# Patient Record
Sex: Female | Born: 1950 | Race: White | Hispanic: No | Marital: Married | Smoking: Never smoker
Health system: Southern US, Community
[De-identification: ages and names within clinical notes are randomized; demographics above are authoritative.]

## PROBLEM LIST (undated history)

## (undated) DIAGNOSIS — C50919 Malignant neoplasm of unspecified site of unspecified female breast: Secondary | ICD-10-CM

## (undated) DIAGNOSIS — F32A Depression, unspecified: Secondary | ICD-10-CM

## (undated) DIAGNOSIS — B019 Varicella without complication: Secondary | ICD-10-CM

## (undated) DIAGNOSIS — K635 Polyp of colon: Secondary | ICD-10-CM

## (undated) DIAGNOSIS — G5600 Carpal tunnel syndrome, unspecified upper limb: Secondary | ICD-10-CM

## (undated) DIAGNOSIS — F329 Major depressive disorder, single episode, unspecified: Secondary | ICD-10-CM

## (undated) DIAGNOSIS — E785 Hyperlipidemia, unspecified: Secondary | ICD-10-CM

## (undated) DIAGNOSIS — I10 Essential (primary) hypertension: Secondary | ICD-10-CM

## (undated) DIAGNOSIS — Z1501 Genetic susceptibility to malignant neoplasm of breast: Secondary | ICD-10-CM

## (undated) DIAGNOSIS — N641 Fat necrosis of breast: Secondary | ICD-10-CM

## (undated) DIAGNOSIS — K2270 Barrett's esophagus without dysplasia: Secondary | ICD-10-CM

## (undated) DIAGNOSIS — K219 Gastro-esophageal reflux disease without esophagitis: Secondary | ICD-10-CM

## (undated) HISTORY — DX: Polyp of colon: K63.5

## (undated) HISTORY — DX: Essential (primary) hypertension: I10

## (undated) HISTORY — DX: Varicella without complication: B01.9

## (undated) HISTORY — PX: OTHER SURGICAL HISTORY: SHX169

## (undated) HISTORY — DX: Fat necrosis of breast: N64.1

## (undated) HISTORY — DX: Malignant neoplasm of unspecified site of unspecified female breast: C50.919

## (undated) HISTORY — PX: CARPAL TUNNEL RELEASE: SHX101

## (undated) HISTORY — DX: Barrett's esophagus without dysplasia: K22.70

## (undated) HISTORY — DX: Genetic susceptibility to malignant neoplasm of breast: Z15.01

## (undated) HISTORY — DX: Major depressive disorder, single episode, unspecified: F32.9

## (undated) HISTORY — DX: Depression, unspecified: F32.A

## (undated) HISTORY — DX: Gastro-esophageal reflux disease without esophagitis: K21.9

## (undated) HISTORY — DX: Hyperlipidemia, unspecified: E78.5

## (undated) HISTORY — DX: Carpal tunnel syndrome, unspecified upper limb: G56.00

---
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
## 1968-03-18 HISTORY — PX: CHOLECYSTECTOMY: SHX55

## 1968-03-18 HISTORY — PX: APPENDECTOMY: SHX54

## 1988-03-18 DIAGNOSIS — C50919 Malignant neoplasm of unspecified site of unspecified female breast: Secondary | ICD-10-CM

## 1988-03-18 HISTORY — DX: Malignant neoplasm of unspecified site of unspecified female breast: C50.919

## 1988-03-18 HISTORY — PX: BREAST SURGERY: SHX581

## 1988-11-16 DIAGNOSIS — G5600 Carpal tunnel syndrome, unspecified upper limb: Secondary | ICD-10-CM

## 1988-11-16 HISTORY — DX: Carpal tunnel syndrome, unspecified upper limb: G56.00

## 1995-03-19 HISTORY — PX: ABDOMINAL HYSTERECTOMY: SHX81

## 2002-01-25 ENCOUNTER — Ambulatory Visit (HOSPITAL_BASED_OUTPATIENT_CLINIC_OR_DEPARTMENT_OTHER): Admission: RE | Admit: 2002-01-25 | Discharge: 2002-01-26 | Payer: Self-pay | Admitting: Plastic Surgery

## 2002-01-25 ENCOUNTER — Encounter (INDEPENDENT_AMBULATORY_CARE_PROVIDER_SITE_OTHER): Payer: Self-pay | Admitting: Specialist

## 2002-03-18 HISTORY — PX: BREAST REDUCTION SURGERY: SHX8

## 2004-03-18 DIAGNOSIS — K635 Polyp of colon: Secondary | ICD-10-CM

## 2004-03-18 HISTORY — DX: Polyp of colon: K63.5

## 2004-06-26 ENCOUNTER — Ambulatory Visit: Payer: Self-pay | Admitting: Orthopedic Surgery

## 2004-08-02 ENCOUNTER — Ambulatory Visit: Payer: Self-pay | Admitting: General Surgery

## 2004-09-25 ENCOUNTER — Ambulatory Visit: Payer: Self-pay | Admitting: General Surgery

## 2005-03-18 DIAGNOSIS — N641 Fat necrosis of breast: Secondary | ICD-10-CM

## 2005-03-18 HISTORY — DX: Fat necrosis of breast: N64.1

## 2005-07-22 ENCOUNTER — Ambulatory Visit: Payer: Self-pay | Admitting: General Surgery

## 2005-07-24 ENCOUNTER — Ambulatory Visit: Payer: Self-pay | Admitting: General Surgery

## 2005-12-31 ENCOUNTER — Ambulatory Visit: Payer: Self-pay | Admitting: Gastroenterology

## 2006-03-18 DIAGNOSIS — K2270 Barrett's esophagus without dysplasia: Secondary | ICD-10-CM

## 2006-03-18 HISTORY — DX: Barrett's esophagus without dysplasia: K22.70

## 2006-07-25 ENCOUNTER — Ambulatory Visit: Payer: Self-pay | Admitting: General Surgery

## 2007-02-18 ENCOUNTER — Ambulatory Visit: Payer: Self-pay | Admitting: Internal Medicine

## 2007-02-23 ENCOUNTER — Ambulatory Visit: Payer: Self-pay | Admitting: Internal Medicine

## 2007-02-23 ENCOUNTER — Encounter: Payer: Self-pay | Admitting: Internal Medicine

## 2007-04-04 DIAGNOSIS — K589 Irritable bowel syndrome without diarrhea: Secondary | ICD-10-CM

## 2007-04-04 DIAGNOSIS — K219 Gastro-esophageal reflux disease without esophagitis: Secondary | ICD-10-CM

## 2007-04-04 DIAGNOSIS — E785 Hyperlipidemia, unspecified: Secondary | ICD-10-CM | POA: Insufficient documentation

## 2007-04-04 DIAGNOSIS — K2270 Barrett's esophagus without dysplasia: Secondary | ICD-10-CM

## 2007-04-04 DIAGNOSIS — Z853 Personal history of malignant neoplasm of breast: Secondary | ICD-10-CM | POA: Insufficient documentation

## 2007-04-04 DIAGNOSIS — F411 Generalized anxiety disorder: Secondary | ICD-10-CM | POA: Insufficient documentation

## 2007-05-05 ENCOUNTER — Ambulatory Visit: Payer: Self-pay | Admitting: Unknown Physician Specialty

## 2007-05-05 ENCOUNTER — Encounter: Payer: Self-pay | Admitting: Internal Medicine

## 2007-05-20 ENCOUNTER — Ambulatory Visit: Payer: Self-pay | Admitting: Internal Medicine

## 2007-05-20 LAB — CONVERTED CEMR LAB
ALT: 20 units/L (ref 0–35)
Amylase: 48 units/L (ref 27–131)
Bilirubin, Direct: 0.1 mg/dL (ref 0.0–0.3)
Lipase: 16 units/L (ref 11.0–59.0)
Total Bilirubin: 0.8 mg/dL (ref 0.3–1.2)
Total Protein: 6.9 g/dL (ref 6.0–8.3)

## 2007-06-18 ENCOUNTER — Encounter: Payer: Self-pay | Admitting: Internal Medicine

## 2007-07-28 ENCOUNTER — Encounter: Payer: Self-pay | Admitting: Internal Medicine

## 2007-08-12 ENCOUNTER — Ambulatory Visit: Payer: Self-pay | Admitting: General Surgery

## 2007-09-15 DIAGNOSIS — D649 Anemia, unspecified: Secondary | ICD-10-CM | POA: Insufficient documentation

## 2007-09-15 DIAGNOSIS — D72819 Decreased white blood cell count, unspecified: Secondary | ICD-10-CM | POA: Insufficient documentation

## 2007-09-15 DIAGNOSIS — R945 Abnormal results of liver function studies: Secondary | ICD-10-CM

## 2007-09-15 DIAGNOSIS — K449 Diaphragmatic hernia without obstruction or gangrene: Secondary | ICD-10-CM | POA: Insufficient documentation

## 2007-09-15 DIAGNOSIS — R112 Nausea with vomiting, unspecified: Secondary | ICD-10-CM

## 2007-09-15 DIAGNOSIS — M549 Dorsalgia, unspecified: Secondary | ICD-10-CM | POA: Insufficient documentation

## 2007-09-24 ENCOUNTER — Ambulatory Visit: Payer: Self-pay | Admitting: General Surgery

## 2008-07-20 ENCOUNTER — Encounter: Payer: Self-pay | Admitting: Internal Medicine

## 2008-07-27 ENCOUNTER — Encounter: Payer: Self-pay | Admitting: Internal Medicine

## 2008-08-16 ENCOUNTER — Ambulatory Visit: Payer: Self-pay | Admitting: General Surgery

## 2008-10-26 ENCOUNTER — Encounter: Payer: Self-pay | Admitting: Internal Medicine

## 2009-01-11 ENCOUNTER — Encounter (INDEPENDENT_AMBULATORY_CARE_PROVIDER_SITE_OTHER): Payer: Self-pay | Admitting: *Deleted

## 2009-02-06 ENCOUNTER — Encounter (INDEPENDENT_AMBULATORY_CARE_PROVIDER_SITE_OTHER): Payer: Self-pay | Admitting: *Deleted

## 2009-02-07 ENCOUNTER — Ambulatory Visit: Payer: Self-pay | Admitting: Internal Medicine

## 2009-02-16 ENCOUNTER — Ambulatory Visit: Payer: Self-pay | Admitting: Internal Medicine

## 2009-02-21 ENCOUNTER — Encounter: Payer: Self-pay | Admitting: Internal Medicine

## 2009-08-22 ENCOUNTER — Ambulatory Visit: Payer: Self-pay | Admitting: Unknown Physician Specialty

## 2009-11-01 ENCOUNTER — Ambulatory Visit: Payer: Self-pay | Admitting: Unknown Physician Specialty

## 2009-11-02 LAB — PATHOLOGY REPORT

## 2010-07-31 NOTE — Assessment & Plan Note (Signed)
[facility]                         GASTROENTEROLOGY OFFICE NOTE   Erin Roberts, Erin Roberts                     MRN:          [id number]  DATE:05/20/2007                            DOB:          08/14/1950    Erin Roberts is a 60 year old, white female, who we have recently  diagnosed and confirmed the diagnosis of Barrett's esophagus, which was  found on previous upper endoscopy in [place].  Repeat biopsies  confirmed the presence of intestinal metaplasia and glandular atypia.  She has been on Prilosec 20 mg a day.  About every four to six weeks,  she develops nausea with subsequent vomiting, which triggers the whole  sequence of sickness, which lasts about two days.  During this time,  patient is unable to eat and she is usually incapacitated.  This has  been occurring intermittently for about a year.  She gives a history of  breast cancer status post chemotherapy in the 1990s.  She also had a  colonoscopy by Dr. Electa Sniff in the past, and she had an open  cholecystectomy for cholelithiasis in 1970.  This was an uncomplicated  procedure, which was elective.  She has never had pancreatitis.  Most  recently on CT scan of the abdomen ordered by Dr. Lin Givens, she was  found to have a generous common bile duct measuring 9 mm with mild  prominence of intrahepatic radicals, but no definite stone.  Her  pancreas appeared normal.  The biliary ampulla was not well visualized.  The patient's overall condition is satisfactory in that she has not lost  any weight, she has a good appetite except for the episode of vomiting.   MEDICATIONS:  1. Simvastatin 20 mg p.o. daily.  2. Zolpidem Tartrate 10 mg p.o. p.r.n.  3. Effexor-XR 75 mg p.o. daily.  4. Prilosec 20 mg p.o. daily.   PHYSICAL EXAMINATION:  VITAL SIGNS:  Blood pressure 130/68, pulse 88,  weight 147 pounds.  GENERAL:  She was alert, oriented, in no distress.  HEENT:  Fundus normal, sclerae are not  icteric.  NECK:  Supple, no adenopathy.  LUNGS:  Clear to auscultation.  COR:  Normal S1 and normal S2.  ABDOMEN:  Soft and nontender with normoactive bowel sounds, and a well-  healed right upper quadrant cholecystectomy scar.  The whole abdomen was  normal.  EXTREMITIES:  No edema.   IMPRESSION:  109. A 60 year old, white female with Barrett's esophagus.  2. Periodic nausea and vomiting.  May be related to breakthrough      gastroesophageal reflux disease.  Recent endoscopy did not show any      anatomic abnormalities of her upper gastrointestinal tract.  3. Abnormal computerized tomography scan of the abdomen showing mild      prominence of the intrahepatic biliary radicals as well as common      bile duct.  The size of the common bile duct is normal for a      postcholecystectomy state and may not represent any obstruction.      Patient's symptoms of periodic vomiting may or may not be related      to  the abnormality on the computerized tomography scan.   PLAN:  1. Increase Prilosec to 20 mg p.o. b.i.d.  This will hopefully control      the breakthrough reflux.  2. We will try to obtain a CT scan from 1990s to see where there was      any evidence of biliary dilatation at that time, which would point      to a chronic state rather than a progressive process.  3. Liver function tests, amylase, lipase, and CA19-9 today.  4. Depending on the results of the lab tests and the comparison with      the prior CT scan, I would consider an ERCP.  At this point,      however, I do not have enough evidence that the abnormality is      pathological and that her symptoms are in any way related to this      abnormality.  5. Phenergan 25 mg, take p.r.n. nausea at the onset of the attack.     Hedwig Morton. Juanda Chance, MD  Electronically Signed    DMB/MedQ  DD: 05/20/2007  DT: 05/20/2007  Job #: [id number]   cc:   Elnita Maxwell L. Lin Givens, M.D.  Adela Glimpse

## 2010-07-31 NOTE — Assessment & Plan Note (Signed)
[facility]                         GASTROENTEROLOGY OFFICE NOTE   Erin Roberts, Erin Roberts                     MRN:          [id number]  DATE:02/18/2007                            DOB:          04/19/1950    Erin Roberts is a very nice 60 year old white female who comes for second  opinion of Barrett's esophagus.  She was found to have a Barrett's  esophagus on upper endoscopy 1 year ago after having 6 month history of  gastroesophageal reflux disease and dyspepsia.  She denies ever having  reflux prior to that, even during her pregnancies.  She denied  dysphagia.  On biopsies by Dr. Marva Panda, the path report esophageus was  acute and chronic inflammation consistent with Barrett's esophagus.  I  could not find any description as to whether there were any goblet cells  or metaplasia.  The patient was put on Prilosec 20 mg a day, which she  has taken religiously on a daily basis with complete resolution of all  of her GI symptoms.  She has discontinued the Prilosec for 2 to 3 days  at a time only to find out that she has recurrent reflux.  She really  has not followed antireflux measures.  As far as her irritable bowel  syndrome is concerned, she has been doing better on high fiber diet.  She has a history of colon polyps in the past.  Her last colonoscopy 3  to 4 years ago.  She is for recall next year.   MEDICATIONS:  1. Simvastatin 20 mg p.o. daily.  2. Zolpidem 10 mg p.o. daily.  3. Effexor XR 75 mg p.o. daily since 2003.  4. Prilosec 20 mg p.o. daily.   PAST HISTORY:  Significant for carpal tunnel syndrome surgery,  appendectomy, breast biopsies, hysterectomy, open cholecystectomy.  She  had cancer of the breast and hyperlipidemia.   FAMILY HISTORY:  Negative for colon cancer.  Positive for diabetes in  father and sister.   SOCIAL HISTORY:  Married with 1 child.  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  She does not smoke but drinks alcohol  socially.   REVIEW OF SYSTEMS:  Positive for history of symptoms as in the history  of present illness.   PHYSICAL EXAM:  Blood pressure 140/70, pulse 88, weight 146 pounds.  She was alert and oriented, in no acute distress.  Sclerae not icteric.  NECK:  Supple.  No adenopathy.  LUNGS:  Clear to auscultation.  Her voice was normal.  No cough.  COR:  Normal S1, normal S2.  ABDOMEN:  Soft and nontender with somewhat protuberant, but without any  palpable mass or fullness.  RECTAL:  Normal rectal tone.  Stool was Hemoccult negative.   IMPRESSION:  91. A 60 year old white female with diagnosis of Barrett's esophagus      base on upper endoscopy and biopsies of the gastroesophageal      junction in October 2007.  The patient is questioning the      diagnosis.  2. Gastroesophageal reflux disease, currently under good control on      Prilosec 20  mg a day.  3. History of irritable bowel syndrome.  Currently under good control.  4. History of colon polyps.  As per the patient's report, she is      apparently due for colonoscopy next year.   PLAN:  1. I will have her esophageal biopsies reviewed by pathology at [facility].  2. Repeat upper endoscopy with appropriate biopsies to assess      Barrett's esophagus.  3. Continue Prilosec 20 mg daily for now.  4. The patient was instructed on antireflux measures.     Erin Roberts. Juanda Chance, MD  Electronically Signed    DMB/MedQ  DD: 02/18/2007  DT: 02/18/2007  Job #: [id number]   cc:   Dr. Francia Greaves

## 2010-08-03 NOTE — Letter (Signed)
June 03, 2007    Francia Greaves, M.D.  [address]  [facility]  [place], [place]  [address]   RE:  ALVARETTA, EISENBERGER  MRN:  [id number]  /  DOB:  1950/03/25   Dear Dr. Lin Givens:   I would like to give you a followup on Ms. Funnell who was recently  diagnosed with Barrett's esophagus. Her upper endoscopy in December 2008  showed fragments of intestinal metaplasia with some glandular atypia.  There was no dysplasia. She is scheduled for next upper endoscopy in two  years, that is in December 2010. Her CT scan of the abdomen done in your  office showed some abnormality pertaining to mild  ampullary fullness,  but her liver function tests have been normal according to her, and her  common bile duct looked normal. Her pancreas also looked normal. I  obtained old CT scans for review .The one from 2001 showed normal  pancreas, spleen, and small cystic lesions in the liver which were  rather stable, there were no comments pertaining to her biliary tract.   I have spoken to Ms. Dekay today, and she is feeling fine on  omeprazole 10 mg a day. The episodes of nausea and queasiness have  subsided. Since I do not have any evidence of clear gastrointestinal  pathology here pertaining to her pancreas or bile duct, I am going to  hold off on the ERCP. I talked to Ms. Traywick about it, and she agrees  that she does not want anything done unless she develops specific  symptoms.   I have encouraged her to have a liver function test done in your office  in three months, and as long as they are normal, she does not need to  see me. I will have to do a repeat endoscopy on her in two years anyway,  but if before that her gastrointestinal symptoms recur I would consider  doing a diagnostic ERCP or possibly repeating the CT scan or even doing  an MRCP to have a  better view of the common bile duct and intrahepatic ducts as well. We  will make the decision at the time of her next office  visit. Thank you  for allowing me to share in her care.   Best regards,    Sincerely,      Hedwig Morton. Juanda Chance, MD  Electronically Signed    DMB/MedQ  DD: 06/03/2007  DT: 06/03/2007  Job #: [id number]

## 2010-08-03 NOTE — Op Note (Signed)
NAMEANIAYA, BACHA                        ACCOUNT NO.:  [id number]   MEDICAL RECORD NO.:  [id number]                   PATIENT TYPE:  AMB   LOCATION:  DSC                                  FACILITY:  [facility]   PHYSICIAN:  Brantley Persons, M.D.             DATE OF BIRTH:  1950-08-28   DATE OF PROCEDURE:  01/25/2002  DATE OF DISCHARGE:                                 OPERATIVE REPORT   PREOPERATIVE DIAGNOSES:  1. History of right lumpectomy with radiation therapy for breast cancer.  2. Bilateral macromastia.   POSTOPERATIVE DIAGNOSES:  1. History of right lumpectomy with radiation therapy for breast cancer.  2. Bilateral macromastia.   OPERATION/PROCEDURE:  Bilateral reduction mammoplasty.   SURGEON:  Brantley Persons, M.D.   ASSISTANT:  Bing Neighbors. Clearance Coots, M.D.   ESTIMATED BLOOD LOSS:  300 cc.   FLUIDS REPLACED:  2400 cc crystalloid.   URINE OUTPUT:  200 cc.   ANESTHESIA:  General endotracheal anesthesia.   ANESTHESIOLOGIST:  Burna Forts, M.D.   COMPLICATIONS:  None.   INDICATIONS:  The patient is a 60 year old Caucasian female who has  undergone a right lumpectomy with radiation therapy for treatment of right  breast cancer.  In that process her right breast is now smaller compared to  the left.  Additionally she also has clinical signs of macromastia that is  asymptomatic.  She would like to have more symmetric breasts as well as  smaller breasts.  Due to this we can perform bilateral reduction  mammoplasties to even out the size and shape of her breasts.  The right  breast radiation changes will need to be excised and then the breast shaped  accordingly.  The patient requests that we proceed with the surgery at this  time.   JUSTIFICATION FOR OVERNIGHT STAY:  Progressive pain control, along with  ambulation and monitoring of nipples and breast flaps.   DESCRIPTION OF PROCEDURE:  The patient was marked in the preoperative  holding area in the pattern  of Wise for the future reduction mammoplasties.  She was then taken back to the OR and placed on the table in the supine  position.  After adequate general endotracheal anesthesia was obtained, the  base of the breasts were injected with 1% lidocaine with epinephrine.  A  minimal amount was placed in the right breast compared to the left due to  the radiation changes.  The chest was then prepped with Betadine and alcohol  and draped in the sterile fashion.   First, the right breast reduction was performed.  The nipple was marked with  a 45 mm nipple marker.  This was then incised and skin de-epithelialized  around it down to the mammary crease in inferior pedicle pattern.  Next, the  medial, superior and lateral skin flaps were elevated down to the chest  wall.  The excess fat and glandular tissue were excised from the inferior  pedicle.  In the course of the de-epithelialization, there was a lot of  scarring and fibrosis that was apparent in the skin affected by the  radiation therapy.  I also left a broad base to the inferior pedicle to  maintain good blood supply to the nipple.  As much of the skin affected by  the radiation changes as well as the breast tissue was excised as could be  done to still allow a reasonable size to the patient's breast.  The nipple  was then examined and found to be pink and viable.  The wound was irrigated  with saline irrigation.  Meticulous hemostasis was obtained with the Bovie  electrocautery.  A #10 JP flat, fully fluted drain was then placed into the  wound.  The skin flaps were brought together at the inverted T junction with  a 2-0 Prolene suture.  The incisions were stapled for temporary closure.   Attention was then turned to the left breast.  In a similar fashion the  nipple was marked with a 45 mm nipple marker.  This was incised.  The skin  was then de-epithelialized down to the inframammary crease  in an inferior  pedicle pattern.  Next, the  medial, superior and lateral skin flaps were  elevated down to the chest wall.  The excess fat and glandular tissue were  removed from inferior pedicle.  The nipple was then examined and found to be  pink and viable.  The wound was irrigated saline irrigation.  Meticulous  hemostasis obtained with the Bovie electrocautery.  The inferior pedicle was  centralized using 3-0 Prolene suture.  The #10 JP flat, fully fluted drain  was then placed into the wound.  The skin flaps were brought together and  the inverted T junction with a 2-0 Prolene suture.  The incision was then  stapled for temporary closure.   The breasts were then compared and found to have good shape and symmetry.  All the staples were then removed and the incisions closed using 3-0  Monocryl in a dermal layer followed by a 4-0 Monocryl running intracuticular  stitch on the skin.  The patient was then placed in the upright position.  The future location of the nipple-areolar complexes was marked using the 38  mm nipple marker.  She was then placed back into the recumbent position.   First, the future nipple-areolar complex on the right breast mound was  incised.  This tissue was excised full-thickness.  The nipple was then  examined and found to be pink and viable, brought out into this aperture and  sewn in place using 4-0 Monocryl interrupted dermal sutures followed by 5-0  Monocryl intracuticular stitch on the skin.  Most of the fibrotic skin had  been removed so most of this skin was soft.  In a likewise fashion, the  future nipple-areolar complex on the left breast mound was incised .  The  tissue was excised full-thickness.  The nipple was then examined and found  to be pink and viable and brought out into the aperture and sewn in place  using 4-0 Monocryl interrupted dermal sutures followed by 5-0 Monocryl  intracuticular stitch on the skin.  The JP drains were sewn in place using 3- 0 nylon suture.  The incisions were  then dressed with Benzoin and Steri-  Strips, the nipples additionally bacitracin ointment and Adaptic.  4 x 4s  were then placed over the incisions and the patient was placed in a light  postoperative support bra.  There were no complications.  The patient  tolerated the procedure well.   The patient was then extubated and taken to the recovery room in stable  condition.  The final needle and sponge counts were reported to be correct  at the end of the case.  The patient will remain overnight in the [facility] for  aggressive pain control along with ambulation, monitoring of the nipples and  breast flaps.  Discharge is planned for the morning.   AMOUNTS OF BREAST TISSUE REMOVED:  Right breast:  324 g.  Left breast:  614  g.                                               Brantley Persons, M.D.    MC/MEDQ  D:  01/25/2002  T:  01/26/2002  Job:  [id number]   cc:   Leonette Most A. Clearance Coots, M.D.  [address]  [place]  [place] [address]  Fax: [phone]

## 2010-09-27 ENCOUNTER — Ambulatory Visit: Payer: Self-pay | Admitting: Unknown Physician Specialty

## 2011-03-19 DIAGNOSIS — I10 Essential (primary) hypertension: Secondary | ICD-10-CM

## 2011-03-19 HISTORY — DX: Essential (primary) hypertension: I10

## 2011-04-12 ENCOUNTER — Encounter: Payer: Self-pay | Admitting: Internal Medicine

## 2011-10-01 ENCOUNTER — Ambulatory Visit: Payer: Self-pay | Admitting: Internal Medicine

## 2011-10-02 ENCOUNTER — Ambulatory Visit: Payer: Self-pay | Admitting: Internal Medicine

## 2011-10-28 ENCOUNTER — Ambulatory Visit: Payer: Self-pay | Admitting: Internal Medicine

## 2011-11-14 ENCOUNTER — Ambulatory Visit: Payer: Self-pay | Admitting: General Surgery

## 2011-12-05 ENCOUNTER — Encounter: Payer: Self-pay | Admitting: Internal Medicine

## 2011-12-12 ENCOUNTER — Telehealth: Payer: Self-pay | Admitting: Internal Medicine

## 2011-12-12 NOTE — Telephone Encounter (Signed)
Transferred GI Care elsewhere

## 2012-03-18 HISTORY — PX: MASTECTOMY: SHX3

## 2012-03-30 ENCOUNTER — Ambulatory Visit: Payer: Self-pay | Admitting: Internal Medicine

## 2012-04-18 HISTORY — PX: UPPER GI ENDOSCOPY: SHX6162

## 2012-05-05 ENCOUNTER — Ambulatory Visit: Payer: Self-pay | Admitting: General Surgery

## 2012-05-16 HISTORY — PX: COLONOSCOPY: SHX174

## 2012-06-09 ENCOUNTER — Ambulatory Visit: Payer: Self-pay | Admitting: General Surgery

## 2012-06-09 DIAGNOSIS — D128 Benign neoplasm of rectum: Secondary | ICD-10-CM

## 2012-06-09 DIAGNOSIS — D129 Benign neoplasm of anus and anal canal: Secondary | ICD-10-CM

## 2012-06-10 ENCOUNTER — Encounter: Payer: Self-pay | Admitting: General Surgery

## 2012-06-15 ENCOUNTER — Telehealth: Payer: Self-pay | Admitting: General Surgery

## 2012-06-15 NOTE — Telephone Encounter (Signed)
The patient underwent a colonoscopy on 06/10/2012 to followup previously resected colonic polyps. 4 polyps were then applied in the rectum, the largest at 15 cm. The dominant polyp at 15 cm was a tubular adenoma without evidence of atypia or dysplasia. The remaining polyps were hyperplastic.  The patient has been encouraged to have a follow up exam in 5 years.

## 2012-06-16 ENCOUNTER — Encounter: Payer: Self-pay | Admitting: General Surgery

## 2012-11-03 ENCOUNTER — Ambulatory Visit: Payer: Self-pay | Admitting: General Surgery

## 2012-11-04 ENCOUNTER — Encounter: Payer: Self-pay | Admitting: General Surgery

## 2012-11-11 ENCOUNTER — Ambulatory Visit (INDEPENDENT_AMBULATORY_CARE_PROVIDER_SITE_OTHER): Payer: 59 | Admitting: General Surgery

## 2012-11-11 ENCOUNTER — Encounter: Payer: Self-pay | Admitting: General Surgery

## 2012-11-11 VITALS — BP 132/70 | HR 74 | Resp 12 | Ht 62.0 in | Wt 155.0 lb

## 2012-11-11 DIAGNOSIS — R920 Mammographic microcalcification found on diagnostic imaging of breast: Secondary | ICD-10-CM

## 2012-11-11 NOTE — Patient Instructions (Addendum)
Patient to be scheduled for right breast stereotactic biopsy.   Breast Biopsy, Stereotactic A stereotactic breast biopsy takes a tissue sample from the breast with a special instrument. This is done when:  The problem (lump, abnormality, mass) can be seen on X-ray, but not felt on physical exam.  Suspicious, small calcium deposits (calcifications) are seen in the breast.  There is a change in shape or appearance of the breast, thickening, or asymmetry on mammogram (breast X-ray).  You have nipple changes (unusual or bloody discharge, crusting, retraction, dimpling).  Your caregiver is making a surgical diagnosis. The biopsy may be done on a special table, with your face down and your breasts placed through openings in the table. Computerized imaging (special form of X-rays) is used. The images are not obtained using regular X-ray film. So, exposure to radiation is reduced. Images are seen through several different angles. The surgeon removes small pieces of the suspicious tissue through a hollow needle. The tissue will be sent to the lab for analysis. The surgeon can look at the pictures right away, rather than wait for an X-ray to be developed. Your caregiver can mark the lesions (abnormal tissue formations) electronically. Then the computer can tell exactly where the problem is, or if it has moved. BENEFITS OF THE PROCEDURE  This is a good way to see if tiny lumps, abnormal looking tissue, or calcium deposits that you cannot feel are cancerous or require further treatment or follow-up.  Needle biopsy is a simple procedure. It may be performed in an outpatient imaging center. This means you have the procedure and go home the same day, without checking into a hospital.  It is less painful than open surgery. The results are as accurate as when a tissue sample is removed surgically.  The procedure is faster, less expensive, less invasive, does not distort the breast, and leaves little or no  scar.  Breast defects, which can make future mammograms hard to read and interpret, do not remain.  Recovery time is brief. Patients can soon resume their normal activities.  Using VAD (vacuum assisted device) may make it possible to remove entire lesions.  A breast biopsy can indicate if you need surgery, other treatment, or combined treatment. LET YOUR CAREGIVER KNOW ABOUT:  Allergies.  Medications taken, including herbs, eye drops, over-the-counter medications, and creams.  Use of steroids (by mouth or creams).  Previous problems with anesthetics or numbing medication.  If you are taking blood thinner medications or aspirin.  Possibility of pregnancy, if this applies.  History of blood clots (thrombophlebitis).  History of bleeding or blood problems.  Previous surgery.  Other health problems. RISKS AND COMPLICATIONS  Infection (germ growing in the wound). This can often be treated with antibiotics.  Bleeding, following surgery. Your surgeon takes every precaution to keep this from happening.  There is some concern that if a cancerous mass is present, cancer cells might be spread by the needle. Whether this actually happens is not known. It does not appear to be a significant risk.  X-ray guided breast biopsy is not infallible (not always correct). The problem may be missed or the extent of the problem may be underestimated. This would mean the biopsy did not manage to remove a piece of the diseased tissue or enough of the diseased tissue.  Lesions present, with calcium deposits scattered throughout the breast, are difficult to target by stereotactic method. Those lesions near the chest wall also are hard to learn about by this method.  If the mammogram shows only a vague change in tissue density, but no definite mass or nodule, the X-ray guided method may not be successful. Occasionally, even after a successful biopsy, the tissue diagnosis remains uncertain. A surgical  biopsy will be needed, if abnormal or precancerous cells are found on core biopsy.  Altering or deforming of the breast.  Unable to find, or missing the lesion.  Rarely, the needle may go through the chest wall into the lung area. TWO BIOPSY INSTRUMENTS MAY BE USED IN THE PROCEDURE The conventional biopsy device (core needle biopsy device) consists of an inner needle with a trough extending from it at one end, and an overlying sheath. It is attached to a spring-loaded mechanism that propels it forward. The trough fills with tissue. The outer sheath instantly moves forward to cut the tissue and keep it in the trough. Each sample is obtained in a fraction of a second. It is necessary to withdraw the needle after each sample is taken to collect the tissue.  A newer type of instrument, the VAD (vacuum assisted device), uses vacuum pressure to pull breast tissue into a needle and remove it. The needle does not need to be withdrawn after each sampling. Another advantage is that biopsies are obtained in an orderly manner, by rotating the device. This helps make sure that the entire area of interest will be sampled. When using the automated core biopsy needle, sampling is more random.  FOR COMFORT DURING THE TEST  Relax as much as possible.  Try to follow instructions, to speed up the test.  Let your caregiver know if you are uncomfortable, anxious, or in pain. PROCEDURE  You are awake during the procedure, and you go home the same day (outpatient). A specially trained radiologist will do this procedure. First, the skin is cleansed. Then, it is injected with a local anesthetic. A small nick is made in the skin, and the tip of the biopsy needle is put into the calculated site of the lesion. A special mammography machine uses ionizing radiation to help guide the radiologist's instrument to the site of the abnormal growth. At this point, stereo images are again obtained, to confirm that the needle tip is at  the problem area. Usually 5 to 10 samples are collected when doing a core biopsy. At least 12 are collected when using the vacuum assisted device (VAD). Then, a final set of images is obtained. If they show that the lesion has been mostly or completely removed, a small clip is left at the biopsy site. This is so that it can be easily located, in case the lesion turns out to be cancer. Afterward, the skin opening is stitched (sutured) or taped closed, and covered with a dressing. Your caregiver may apply a pressure dressing and an ice pack, to prevent bleeding and swelling in the breast.  X-ray guided breast biopsy can take 30 minutes to 1 hour, or more. The X-rays usually have no side effects, and no radiation remains in your body. There is usually little or no pain. Usually no scar is left from the tiny skin incision. Many women find that the major discomfort of the procedure is from lying on their stomach, or staying in 1 position for the length of the procedure. This discomfort may be reduced by carefully placed cushions. You should wear a good support bra to the procedure. You will be asked to remove jewelry, dentures, eye glasses, metal objects, or clothing that might interfere with the  X-ray images. You may want to have someone with you, to take you home after the procedure. AFTER THE PROCEDURE   After surgery, if you are doing well and have no problems, you will be allowed to go home.  You may resume your regular diet, or as directed by your caregiver. HOME CARE INSTRUCTIONS   Follow your caregiver's recommendations for medications, care of the biopsy site, follow-up appointments, and further treatment.  Only take over-the-counter or prescription medicines for pain, discomfort, or fever as directed by your caregiver.  An ice pack applied to the affected area may help with discomfort and keep the swelling down.  Change dressings as directed.  Wear a good support bra for as long as your  caregiver recommends.  Avoid strenuous activity for at least 24 hours, or as advised by your caregiver. Finding out the results of your test Not all test results are available during your visit. If your test results are not back during the visit, make an appointment with your caregiver to find out the results. Do not assume everything is normal if you have not heard from your caregiver or the medical facility. It is important for you to follow up on all of your test results.  SEEK MEDICAL CARE IF:   You develop a rash.  You have problems with your medicines.  You become lightheaded or dizzy. SEEK IMMEDIATE MEDICAL CARE IF:   There is increased bleeding (more than a small spot) from the biopsy site.  You notice redness, swelling, or increasing pain in the wound.  Pus is coming from the wound.  You have a fever.  You notice a bad smell coming from the wound or dressing.  You develop shortness of breath.  You develop chest pain.  You pass out. Document Released: 12/01/2002 Document Revised: 05/27/2011 Document Reviewed: 01/06/2009 [facility] Patient Information 2014 [place], [place].  Patient has been scheduled for a right breast stereotactic biopsy at [facility] for 11-30-12 at 2 pm. She will check-in at the [facility] at 1:30 pm. This patient is aware of date, time, and instructions. Patient verbalizes understanding.

## 2012-11-11 NOTE — Progress Notes (Signed)
Patient ID: Erin Roberts, female   DOB: 1950-08-09, 62 y.o.   MRN: [id number]  Chief Complaint  Patient presents with  . Follow-up    mammogram    HPI Erin Roberts is a 62 y.o. female who presents for a breast evaluation. The most recent mammogram was done on 10/29/12.Patient does perform regular self breast checks and gets regular mammograms done.  The patient denies any new problems with the breasts at this time.   HPI  Past Medical History  Diagnosis Date  . Breast cancer 1990  . Carpal tunnel syndrome 1990's  . Hypertension 2013  . Malignant neoplasm of breast (female), unspecified site     Right  . Tendonitis 1990's  . Fat necrosis of breast 2007    Left  . Barrett's esophagus 2008  . Colon polyps 2006    Past Surgical History  Procedure Laterality Date  . Colonoscopy  March 2014    4 rectal polyps, one tubular adenoma. Followup exam due in 2019.  . Breast reduction surgery  2004  . Abdominal hysterectomy  1997  . Cholecystectomy  1970  . Breast lumpectomy Right 1990  . Tonsillectomy  1967  . Appendectomy  1970  . Carpal tunnel release  1998, 2004    Family History  Problem Relation Age of Onset  . Colon cancer Father 23  . Colon cancer Mother 21  . Colon cancer Sister 4    Social History History  Substance Use Topics  . Smoking status: Never Smoker   . Smokeless tobacco: Never Used  . Alcohol Use: Yes    Allergies  Allergen Reactions  . Codeine     REACTION: nausea/vomiting    Current Outpatient Prescriptions  Medication Sig Dispense Refill  . omeprazole (PRILOSEC) 20 MG capsule Take 1 capsule by mouth daily.      . simvastatin (ZOCOR) 40 MG tablet Take 1 tablet by mouth daily.      Marland Kitchen triamterene-hydrochlorothiazide (MAXZIDE-25) 37.5-25 MG per tablet Take 1 tablet by mouth daily.      Marland Kitchen venlafaxine (EFFEXOR) 75 MG tablet Take 1 tablet by mouth daily.      Marland Kitchen zolpidem (AMBIEN) 10 MG tablet Take 1 tablet by mouth daily.       No current  facility-administered medications for this visit.    Review of Systems Review of Systems  Constitutional: Negative.   Respiratory: Negative.   Cardiovascular: Negative.     Blood pressure 132/70, pulse 74, resp. rate 12, height 5\' 2"  (1.575 m), weight 155 lb (70.308 kg).  Physical Exam Physical Exam  Constitutional: She is oriented to person, place, and time. She appears well-developed and well-nourished.  Neck: No thyromegaly present.  Cardiovascular: Normal rate, regular rhythm and normal heart sounds.   No murmur heard. Pulmonary/Chest: Effort normal and breath sounds normal. Right breast exhibits no inverted nipple, no mass, no nipple discharge, no skin change and no tenderness. Left breast exhibits no inverted nipple, no mass, no nipple discharge, no skin change and no tenderness.  Well healed incisions on the left breast from a reduction mammoplasty.   Well healed incision on the right breast from her original wide excision and axillary dissection.     Lymphadenopathy:    She has no cervical adenopathy.    She has no axillary adenopathy.  Neurological: She is alert and oriented to person, place, and time.  Skin: Skin is warm and dry.    Data Reviewed Bilateral mammograms dated November 03, 2012 were reviewed. 8  area of microcalcifications adjacent to a previous biopsy clip are felt to be more heterogeneous by the radiologist. The left breast is unremarkable. BI-RAD-4.  Upper endoscopy completed December 31, 2005 showed evidence of Barrett's esophagus with acute and chronic inflammation. No dysplasia was identified.  Assessment    Minimal interval change of the right breast.     Plan    Arrangements were made for a stereotactic biopsy to remove the microcalcifications of concern.  The patient is familiar with the procedure.     Patient has been scheduled for a right breast stereotactic biopsy at [facility] for 11-30-12 at 2 pm. She will check-in at the [facility] at 1:30 pm. This patient is aware of date, time, and instructions. Patient verbalizes understanding.  The patient will be contacted to determine the time of her last upper endoscopy.  If 2007 was last exam, repeat examination is indicated due to the risk of malignancy.  Earline Mayotte 11/11/2012, 9:57 PM

## 2012-11-30 ENCOUNTER — Ambulatory Visit: Payer: Self-pay | Admitting: General Surgery

## 2012-11-30 DIAGNOSIS — R920 Mammographic microcalcification found on diagnostic imaging of breast: Secondary | ICD-10-CM

## 2012-12-01 ENCOUNTER — Telehealth: Payer: Self-pay | Admitting: General Surgery

## 2012-12-01 NOTE — Telephone Encounter (Signed)
Notified phone report from pathology showed area of breast cancer.  Arrangements to meet tomorrow afternoon to review options for care.

## 2012-12-02 ENCOUNTER — Encounter: Payer: Self-pay | Admitting: General Surgery

## 2012-12-02 ENCOUNTER — Ambulatory Visit (INDEPENDENT_AMBULATORY_CARE_PROVIDER_SITE_OTHER): Payer: 59 | Admitting: General Surgery

## 2012-12-02 VITALS — BP 140/74 | HR 88 | Resp 16 | Wt 157.0 lb

## 2012-12-02 DIAGNOSIS — Z853 Personal history of malignant neoplasm of breast: Secondary | ICD-10-CM

## 2012-12-02 DIAGNOSIS — C50911 Malignant neoplasm of unspecified site of right female breast: Secondary | ICD-10-CM

## 2012-12-02 DIAGNOSIS — C50919 Malignant neoplasm of unspecified site of unspecified female breast: Secondary | ICD-10-CM

## 2012-12-02 NOTE — Progress Notes (Signed)
Patient ID: Erin Roberts, female   DOB: 17-Oct-1950, 62 y.o.   MRN: [id number]  Chief Complaint  Patient presents with  . Follow-up    discussion    HPI Erin Roberts is a 62 y.o. female return today accompanied by her husband to discuss her recently completed right breast biopsy. She had been notified by phone of the biopsy results. HPI  Past Medical History  Diagnosis Date  . Breast cancer 1990  . Carpal tunnel syndrome 1990's  . Hypertension 2013  . Malignant neoplasm of breast (female), unspecified site     Right  . Tendonitis 1990's  . Fat necrosis of breast 2007    Left  . Barrett's esophagus 2008  . Colon polyps 2006    Past Surgical History  Procedure Laterality Date  . Colonoscopy  March 2014    4 rectal polyps, one tubular adenoma. Followup exam due in 2019.  . Breast reduction surgery  2004  . Abdominal hysterectomy  1997  . Cholecystectomy  1970  . Breast lumpectomy Right 1990  . Tonsillectomy  1967  . Appendectomy  1970  . Carpal tunnel release  1998, 2004    Family History  Problem Relation Age of Onset  . Colon cancer Father 9  . Colon cancer Mother 54  . Colon cancer Sister 74    Social History History  Substance Use Topics  . Smoking status: Never Smoker   . Smokeless tobacco: Never Used  . Alcohol Use: Yes    Allergies  Allergen Reactions  . Codeine     REACTION: nausea/vomiting    Current Outpatient Prescriptions  Medication Sig Dispense Refill  . omeprazole (PRILOSEC) 20 MG capsule Take 1 capsule by mouth daily.      . simvastatin (ZOCOR) 40 MG tablet Take 1 tablet by mouth daily.      Marland Kitchen triamterene-hydrochlorothiazide (MAXZIDE-25) 37.5-25 MG per tablet Take 1 tablet by mouth daily.      Marland Kitchen venlafaxine (EFFEXOR) 75 MG tablet Take 1 tablet by mouth daily.      Marland Kitchen zolpidem (AMBIEN) 10 MG tablet Take 1 tablet by mouth daily.       No current facility-administered medications for this visit.    Review of Systems Review of  Systems  Blood pressure 140/74, pulse 88, resp. rate 16, weight 157 lb (71.215 kg).  Physical Exam Physical Exam  Constitutional: She appears well-developed and well-nourished.  Neck: Normal range of motion. Neck supple.  Cardiovascular: Normal rate and regular rhythm.   Pulmonary/Chest: Breath sounds normal.    Data Reviewed Biopsy of the right breast completed November 30, 2012 showed a 3 mm area of invasive mammary carcinoma as well as DCIS.  Assessment    Recurrent right breast cancer 24 years status post conservative therapy.     Plan    Options for management were reviewed: 1) mastectomy versus 2) mastectomy with immediate reconstruction. The patient brought up contralateral mastectomy. This was discouraged as there is no survival advantage for bilateral mastectomy patient.  Options for management would be considered by the patient she will notify the office if she desires second surgical opinion, plastic surgical consultation or to schedule surgery.        Earline Mayotte 12/02/2012, 10:11 PM

## 2012-12-07 ENCOUNTER — Encounter: Payer: Self-pay | Admitting: General Surgery

## 2012-12-08 ENCOUNTER — Ambulatory Visit: Payer: 59

## 2012-12-10 ENCOUNTER — Telehealth: Payer: Self-pay | Admitting: *Deleted

## 2012-12-10 ENCOUNTER — Telehealth: Payer: Self-pay | Admitting: General Surgery

## 2012-12-10 NOTE — Telephone Encounter (Signed)
Message for patient to call the office.  We need to arrange a date for surgery. Patient will require a pre-op visit.

## 2012-12-10 NOTE — Telephone Encounter (Signed)
Patient's surgery has been scheduled for 12-28-12 at [facility]. Paperwork has been mailed to the patient. She will come in the office for a pre-op visit on 12-17-12 at 3 pm.

## 2012-12-10 NOTE — Telephone Encounter (Signed)
I spoke with the patient by phone and reviewed the pros and cons of prophylactic contralateral mastectomy. The patient is aware that there is no clear survival advantage. She has however had multiple biopsies in the past, and I think has reached her load limit in regards to additional mammographic studies. We'll proceed with bilateral mastectomy in the near future.

## 2012-12-16 ENCOUNTER — Other Ambulatory Visit: Payer: Self-pay | Admitting: General Surgery

## 2012-12-16 DIAGNOSIS — C50911 Malignant neoplasm of unspecified site of right female breast: Secondary | ICD-10-CM

## 2012-12-16 DIAGNOSIS — C50919 Malignant neoplasm of unspecified site of unspecified female breast: Secondary | ICD-10-CM

## 2012-12-16 HISTORY — DX: Malignant neoplasm of unspecified site of unspecified female breast: C50.919

## 2012-12-17 ENCOUNTER — Ambulatory Visit: Payer: 59 | Admitting: General Surgery

## 2012-12-23 ENCOUNTER — Telehealth: Payer: Self-pay | Admitting: *Deleted

## 2012-12-23 ENCOUNTER — Ambulatory Visit: Payer: Self-pay | Admitting: Anesthesiology

## 2012-12-23 LAB — HEMOGLOBIN: HGB: 12.1 g/dL (ref 12.0–16.0)

## 2012-12-23 LAB — POTASSIUM: Potassium: 4.1 mmol/L (ref 3.5–5.1)

## 2012-12-23 NOTE — Telephone Encounter (Signed)
Reviewed FMLA papers

## 2012-12-28 ENCOUNTER — Ambulatory Visit: Payer: Self-pay | Admitting: General Surgery

## 2012-12-28 ENCOUNTER — Encounter: Payer: Self-pay | Admitting: General Surgery

## 2012-12-28 DIAGNOSIS — C50419 Malignant neoplasm of upper-outer quadrant of unspecified female breast: Secondary | ICD-10-CM

## 2012-12-28 HISTORY — PX: BREAST SURGERY: SHX581

## 2012-12-30 ENCOUNTER — Encounter: Payer: Self-pay | Admitting: General Surgery

## 2012-12-31 ENCOUNTER — Ambulatory Visit (INDEPENDENT_AMBULATORY_CARE_PROVIDER_SITE_OTHER): Payer: Self-pay | Admitting: *Deleted

## 2012-12-31 DIAGNOSIS — C50919 Malignant neoplasm of unspecified site of unspecified female breast: Secondary | ICD-10-CM

## 2012-12-31 DIAGNOSIS — C50911 Malignant neoplasm of unspecified site of right female breast: Secondary | ICD-10-CM

## 2012-12-31 NOTE — Progress Notes (Signed)
Pt came in dressing removed.  Postop bilateral mastectomy. Incisions clean, JP drain x2 in place.Left average 30 to 67 ml and the Right average 15 to 52 ml redressed.  Rewrapped with fluff gauze and kerlex then ace. Follow up Monday as scheduled.

## 2012-12-31 NOTE — Patient Instructions (Signed)
The patient is aware to call back for any questions or concerns.  

## 2013-01-04 ENCOUNTER — Ambulatory Visit (INDEPENDENT_AMBULATORY_CARE_PROVIDER_SITE_OTHER): Payer: Self-pay | Admitting: General Surgery

## 2013-01-04 ENCOUNTER — Encounter: Payer: Self-pay | Admitting: General Surgery

## 2013-01-04 VITALS — BP 120/72 | HR 80 | Resp 12 | Ht 62.0 in | Wt 154.0 lb

## 2013-01-04 DIAGNOSIS — C50911 Malignant neoplasm of unspecified site of right female breast: Secondary | ICD-10-CM

## 2013-01-04 DIAGNOSIS — C50919 Malignant neoplasm of unspecified site of unspecified female breast: Secondary | ICD-10-CM

## 2013-01-04 NOTE — Progress Notes (Signed)
Patient ID: Erin Roberts, female   DOB: 06/16/1950, 62 y.o.   MRN: [id number]  Chief Complaint  Patient presents with  . Routine Post Op    bilaternal  mastectomy    HPI Erin Roberts is a 62 y.o. female here today for her post op bilateral mastectomy done on 12/28/12. Patient states she is doing well since surgery.   HPI  Past Medical History  Diagnosis Date  . Breast cancer 1990  . Carpal tunnel syndrome 1990's  . Hypertension 2013  . Malignant neoplasm of breast (female), unspecified site     Right  . Tendonitis 1990's  . Fat necrosis of breast 2007    Left  . Barrett's esophagus 2008  . Colon polyps 2006    Past Surgical History  Procedure Laterality Date  . Colonoscopy  March 2014    4 rectal polyps, one tubular adenoma. Followup exam due in 2019.  . Breast reduction surgery  2004  . Abdominal hysterectomy  1997  . Cholecystectomy  1970  . Tonsillectomy  1967  . Appendectomy  1970  . Carpal tunnel release  1998, 2004  . Breast surgery    . Breast surgery Bilateral     mastectomy    Family History  Problem Relation Age of Onset  . Colon cancer Father 62  . Colon cancer Mother 66  . Colon cancer Sister 61    Social History History  Substance Use Topics  . Smoking status: Never Smoker   . Smokeless tobacco: Never Used  . Alcohol Use: Yes    Allergies  Allergen Reactions  . Codeine     REACTION: nausea/vomiting    Current Outpatient Prescriptions  Medication Sig Dispense Refill  . omeprazole (PRILOSEC) 20 MG capsule Take 1 capsule by mouth daily.      . simvastatin (ZOCOR) 40 MG tablet Take 1 tablet by mouth daily.      Marland Kitchen triamterene-hydrochlorothiazide (MAXZIDE-25) 37.5-25 MG per tablet Take 1 tablet by mouth daily.      Marland Kitchen venlafaxine (EFFEXOR) 75 MG tablet Take 1 tablet by mouth daily.      Marland Kitchen zolpidem (AMBIEN) 10 MG tablet Take 1 tablet by mouth daily.       No current facility-administered medications for this visit.    Review of  Systems Review of Systems  Constitutional: Negative.   Respiratory: Negative.   Cardiovascular: Negative.     Blood pressure 120/72, pulse 80, resp. rate 12, height 5\' 2"  (1.575 m), weight 154 lb (69.854 kg).  Physical Exam Physical Exam  Constitutional: She is oriented to person, place, and time. She appears well-developed and well-nourished.  Pulmonary/Chest:  Both mastectomy incisions are healing well without evidence of  Inflammation or induration. No evidence of loculated fluid.  Neurological: She is alert and oriented to person, place, and time.  Skin: Skin is warm and dry.    Data Reviewed Pathology of the right breast showed no evidence of residual invasive mammary carcinoma. Biopsy had shown a 4 mm area. T1a. The left breast showed no evidence of malignancy.  Assessment    Doing well status post right mastectomy for recurrent cancer 24 years after her original therapy as well as left prophylactic mastectomy.     Plan    The right [place] Mons drain was removed without incident. Telfa and Tegaderm dressing applied. Telfa and Tegaderm dressing applied to the left mastectomy site JP drain. The patient may begin showers. She was advised that she may develop a small seroma at  the right mastectomy site and that she should call if it is symptomatic.        Erin Roberts 01/04/2013, 9:29 PM

## 2013-01-04 NOTE — Patient Instructions (Signed)
Patient to return in 1 week.

## 2013-01-12 ENCOUNTER — Encounter: Payer: Self-pay | Admitting: General Surgery

## 2013-01-12 ENCOUNTER — Ambulatory Visit (INDEPENDENT_AMBULATORY_CARE_PROVIDER_SITE_OTHER): Payer: Self-pay | Admitting: General Surgery

## 2013-01-12 VITALS — BP 130/70 | HR 74 | Temp 99.9°F | Resp 16 | Ht 62.0 in | Wt 153.0 lb

## 2013-01-12 DIAGNOSIS — Z853 Personal history of malignant neoplasm of breast: Secondary | ICD-10-CM

## 2013-01-12 DIAGNOSIS — T814XXA Infection following a procedure, initial encounter: Secondary | ICD-10-CM

## 2013-01-12 DIAGNOSIS — T8140XA Infection following a procedure, unspecified, initial encounter: Secondary | ICD-10-CM | POA: Insufficient documentation

## 2013-01-12 MED ORDER — SULFAMETHOXAZOLE-TMP DS 800-160 MG PO TABS: 1.0000 | ORAL_TABLET | Freq: Two times a day (BID) | ORAL | Status: DC

## 2013-01-12 NOTE — Patient Instructions (Signed)
Patient to have lab work done today. She is to return in 2 days.

## 2013-01-12 NOTE — Progress Notes (Signed)
Patient ID: Erin Roberts, female   DOB: 11-18-1950, 62 y.o.   MRN: [id number]  Chief Complaint  Patient presents with  . Routine Post Op    bilateral mastectomy     HPI Erin Roberts is a 62 y.o. female who presents for a post op bilateral mastectomy. The procedure was performed on 12/28/12. The patient still has a drain in at this time. Some redness is present at this time. The patient states she started feeling lousy last night. New tenderness in the left side that started a couple of days ago. The patient denies any fever or chills.    HPI  Past Medical History  Diagnosis Date  . Breast cancer 1990  . Carpal tunnel syndrome 1990's  . Hypertension 2013  . Malignant neoplasm of breast (female), unspecified site     Right  . Tendonitis 1990's  . Fat necrosis of breast 2007    Left  . Barrett's esophagus 2008  . Colon polyps 2006    Past Surgical History  Procedure Laterality Date  . Colonoscopy  March 2014    4 rectal polyps, one tubular adenoma. Followup exam due in 2019.  . Breast reduction surgery  2004  . Abdominal hysterectomy  1997  . Cholecystectomy  1970  . Tonsillectomy  1967  . Appendectomy  1970  . Carpal tunnel release  1998, 2004  . Breast surgery    . Breast surgery Bilateral     mastectomy    Family History  Problem Relation Age of Onset  . Colon cancer Father 4  . Colon cancer Mother 96  . Colon cancer Sister 28    Social History History  Substance Use Topics  . Smoking status: Never Smoker   . Smokeless tobacco: Never Used  . Alcohol Use: Yes    Allergies  Allergen Reactions  . Codeine     REACTION: nausea/vomiting    Current Outpatient Prescriptions  Medication Sig Dispense Refill  . omeprazole (PRILOSEC) 20 MG capsule Take 1 capsule by mouth daily.      . simvastatin (ZOCOR) 40 MG tablet Take 1 tablet by mouth daily.      Marland Kitchen triamterene-hydrochlorothiazide (MAXZIDE-25) 37.5-25 MG per tablet Take 1 tablet by mouth daily.       Marland Kitchen venlafaxine (EFFEXOR) 75 MG tablet Take 1 tablet by mouth daily.      Marland Kitchen zolpidem (AMBIEN) 10 MG tablet Take 1 tablet by mouth daily.      Marland Kitchen sulfamethoxazole-trimethoprim (BACTRIM DS) 800-160 MG per tablet Take 1 tablet by mouth 2 (two) times daily.  14 tablet  1   No current facility-administered medications for this visit.    Review of Systems Review of Systems  Constitutional: Negative.   Respiratory: Negative.   Cardiovascular: Negative.     Blood pressure 130/70, pulse 74, temperature 99.9 F (37.7 C), temperature source Oral, resp. rate 16, height 5\' 2"  (1.575 m), weight 153 lb (69.4 kg).  Physical Exam Physical Exam  Skin:       Data Reviewed Ultrasound showed a small fluid collection in the right mastectomy site. ChloraPrep was applied to the skin and aspiration undertaken a less than 5 cc of thin, serosanguineous fluid. A culture was sent for aerobic organisms.  The left Blake drain was stripped and about 0.5 cc of the effluent sent for culture.  Assessment    Clinical exam suspicious for wound infection.     Plan    The patient will be started on Bactrim DS pending  cultures. Followup exam in 48 hours.      Patient to have the following labs drawn at [facility] today: CBC with differential/platelet.  Earline Mayotte 01/12/2013, 8:07 PM

## 2013-01-13 LAB — CBC WITH DIFFERENTIAL
Basophils Absolute: 0 10*3/uL (ref 0.0–0.2)
Basos: 0 %
Eosinophils Absolute: 0.1 10*3/uL (ref 0.0–0.4)
Hemoglobin: 11.6 g/dL (ref 11.1–15.9)
Immature Grans (Abs): 0 10*3/uL (ref 0.0–0.1)
Immature Granulocytes: 0 %
Lymphs: 15 %
MCH: 31.3 pg (ref 26.6–33.0)
MCHC: 32.3 g/dL (ref 31.5–35.7)
Monocytes Absolute: 0.7 10*3/uL (ref 0.1–0.9)
Monocytes: 7 %
Neutrophils Absolute: 8.5 10*3/uL — ABNORMAL HIGH (ref 1.4–7.0)
Neutrophils Relative %: 77 %
Platelets: 301 10*3/uL (ref 150–379)
RBC: 3.71 x10E6/uL — ABNORMAL LOW (ref 3.77–5.28)
RDW: 13.6 % (ref 12.3–15.4)
WBC: 11 10*3/uL — ABNORMAL HIGH (ref 3.4–10.8)

## 2013-01-14 ENCOUNTER — Ambulatory Visit (INDEPENDENT_AMBULATORY_CARE_PROVIDER_SITE_OTHER): Payer: Self-pay | Admitting: General Surgery

## 2013-01-14 ENCOUNTER — Encounter: Payer: Self-pay | Admitting: General Surgery

## 2013-01-14 VITALS — BP 144/70 | HR 88 | Temp 98.0°F | Resp 12 | Ht 62.0 in | Wt 151.0 lb

## 2013-01-14 DIAGNOSIS — C50919 Malignant neoplasm of unspecified site of unspecified female breast: Secondary | ICD-10-CM

## 2013-01-14 LAB — AEROBIC CULTURE

## 2013-01-14 NOTE — Progress Notes (Signed)
Patient ID: Erin Roberts, female   DOB: 20-Jan-1951, 62 y.o.   MRN: [id number]  Chief Complaint  Patient presents with  . Routine Post Op    HPI Erin Roberts is a 62 y.o. female.  who presents for a post op bilateral mastectomy with post operative breast infection involving the left breast. The procedure was performed on 12/28/12. The patient still has a left drain in at this time. The redness has gotten better and she feels  better since starting her antibiotics 2 days ago.  HPI  Past Medical History  Diagnosis Date  . Breast cancer 1990  . Carpal tunnel syndrome 1990's  . Hypertension 2013  . Malignant neoplasm of breast (female), unspecified site     Right  . Tendonitis 1990's  . Fat necrosis of breast 2007    Left  . Barrett's esophagus 2008  . Colon polyps 2006    Past Surgical History  Procedure Laterality Date  . Colonoscopy  March 2014    4 rectal polyps, one tubular adenoma. Followup exam due in 2019.  . Breast reduction surgery  2004  . Abdominal hysterectomy  1997  . Cholecystectomy  1970  . Tonsillectomy  1967  . Appendectomy  1970  . Carpal tunnel release  1998, 2004  . Breast surgery    . Breast surgery Bilateral     mastectomy    Family History  Problem Relation Age of Onset  . Colon cancer Father 31  . Colon cancer Mother 90  . Colon cancer Sister 2    Social History History  Substance Use Topics  . Smoking status: Never Smoker   . Smokeless tobacco: Never Used  . Alcohol Use: Yes    Allergies  Allergen Reactions  . Codeine     REACTION: nausea/vomiting    Current Outpatient Prescriptions  Medication Sig Dispense Refill  . omeprazole (PRILOSEC) 20 MG capsule Take 1 capsule by mouth daily.      . simvastatin (ZOCOR) 40 MG tablet Take 1 tablet by mouth daily.      Marland Kitchen sulfamethoxazole-trimethoprim (BACTRIM DS) 800-160 MG per tablet Take 1 tablet by mouth 2 (two) times daily.  14 tablet  1  . triamterene-hydrochlorothiazide  (MAXZIDE-25) 37.5-25 MG per tablet Take 1 tablet by mouth daily.      Marland Kitchen venlafaxine (EFFEXOR) 75 MG tablet Take 1 tablet by mouth daily.      Marland Kitchen zolpidem (AMBIEN) 10 MG tablet Take 1 tablet by mouth daily.       No current facility-administered medications for this visit.    Review of Systems Review of Systems  Constitutional: Negative.   Respiratory: Negative.   Cardiovascular: Negative.     Blood pressure 144/70, pulse 88, temperature 98 F (36.7 C), temperature source Oral, resp. rate 12, height 5\' 2"  (1.575 m), weight 151 lb (68.493 kg).  Physical Exam Physical Exam  Constitutional: She is oriented to person, place, and time. She appears well-developed and well-nourished.  Pulmonary/Chest:  No residual erythema involving the right mastectomy site.  Marked decrease in the erythema and tenderness involving left mastectomy site. Redundant skin at the lateral aspect unchanged. Drain record shows 50+/minus cc per day. Still turbid. Less erythema at the drain exit site.  Neurological: She is alert and oriented to person, place, and time.  Skin: Skin is warm and dry.    Data Reviewed Culture from the right breast aspirate was negative.  Culture of the drain fluid from the left mastectomy site showed MSSA. Sensitive  to Bactrim.  CBC obtained at the last visit showed a mild leukocytosis at 12,000.  Assessment    Left mastectomy site infection, responding to oral antibiotics.     Plan    The patient shown marked improvement in the last 48 hours. We'll plan for followup exam in 4 days, earlier if symptoms change.       Earline Mayotte 01/16/2013, 10:56 AM

## 2013-01-14 NOTE — Patient Instructions (Signed)
The patient is aware to call back for any questions or concerns.  

## 2013-01-16 DIAGNOSIS — T8149XA Infection following a procedure, other surgical site, initial encounter: Secondary | ICD-10-CM | POA: Insufficient documentation

## 2013-01-16 LAB — AEROBIC CULTURE

## 2013-01-18 ENCOUNTER — Encounter: Payer: Self-pay | Admitting: General Surgery

## 2013-01-18 ENCOUNTER — Ambulatory Visit (INDEPENDENT_AMBULATORY_CARE_PROVIDER_SITE_OTHER): Payer: Self-pay | Admitting: General Surgery

## 2013-01-18 VITALS — BP 130/70 | HR 76 | Temp 96.8°F | Resp 12 | Ht 62.0 in | Wt 155.0 lb

## 2013-01-18 DIAGNOSIS — C50919 Malignant neoplasm of unspecified site of unspecified female breast: Secondary | ICD-10-CM

## 2013-01-18 DIAGNOSIS — C50911 Malignant neoplasm of unspecified site of right female breast: Secondary | ICD-10-CM

## 2013-01-18 DIAGNOSIS — T814XXA Infection following a procedure, initial encounter: Secondary | ICD-10-CM

## 2013-01-18 NOTE — Patient Instructions (Signed)
Patient to return in 1 week for follow up visit.

## 2013-01-18 NOTE — Progress Notes (Signed)
Patient ID: Erin Roberts, female   DOB: December 10, 1950, 63 y.o.   MRN: [id number]  Chief Complaint  Patient presents with  . Follow-up    breast infection 1 week follow up    HPI Erin Roberts is a 62 y.o. female who presents for a 1 week breast infection. The patient is doing well with no new complaints at this time. She reports feeling well. No fever or chills. She will be completing her antibiotics as requested.  HPI  Past Medical History  Diagnosis Date  . Breast cancer 1990  . Carpal tunnel syndrome 1990's  . Hypertension 2013  . Malignant neoplasm of breast (female), unspecified site     Right  . Tendonitis 1990's  . Fat necrosis of breast 2007    Left  . Barrett's esophagus 2008  . Colon polyps 2006    Past Surgical History  Procedure Laterality Date  . Colonoscopy  March 2014    4 rectal polyps, one tubular adenoma. Followup exam due in 2019.  . Breast reduction surgery  2004  . Abdominal hysterectomy  1997  . Cholecystectomy  1970  . Tonsillectomy  1967  . Appendectomy  1970  . Carpal tunnel release  1998, 2004  . Breast surgery    . Breast surgery Bilateral     mastectomy    Family History  Problem Relation Age of Onset  . Colon cancer Father 24  . Colon cancer Mother 24  . Colon cancer Sister 47    Social History History  Substance Use Topics  . Smoking status: Never Smoker   . Smokeless tobacco: Never Used  . Alcohol Use: Yes    Allergies  Allergen Reactions  . Codeine     REACTION: nausea/vomiting    Current Outpatient Prescriptions  Medication Sig Dispense Refill  . omeprazole (PRILOSEC) 20 MG capsule Take 1 capsule by mouth daily.      . simvastatin (ZOCOR) 40 MG tablet Take 1 tablet by mouth daily.      Marland Kitchen sulfamethoxazole-trimethoprim (BACTRIM DS) 800-160 MG per tablet Take 1 tablet by mouth 2 (two) times daily.  14 tablet  1  . triamterene-hydrochlorothiazide (MAXZIDE-25) 37.5-25 MG per tablet Take 1 tablet by mouth daily.      Marland Kitchen  venlafaxine (EFFEXOR) 75 MG tablet Take 1 tablet by mouth daily.      Marland Kitchen zolpidem (AMBIEN) 10 MG tablet Take 1 tablet by mouth daily.       No current facility-administered medications for this visit.    Review of Systems Review of Systems  Constitutional: Negative.   Respiratory: Negative.   Cardiovascular: Negative.     Blood pressure 130/70, pulse 76, temperature 96.8 F (36 C), temperature source Oral, resp. rate 12, height 5\' 2"  (1.575 m), weight 155 lb (70.308 kg).  Physical Exam Physical Exam  Constitutional: She is oriented to person, place, and time. She appears well-developed and well-nourished.  Pulmonary/Chest:  Drain removed at today's visit. Well healed incision site.  The residual erythema or induration of the left chest wall. Drain site inflammation resolve. Drain removed without incident as volumes are less than 25 cc per day. Dry gauze and Tegaderm dressing applied. To be removed tomorrow by the patient.  Neurological: She is alert and oriented to person, place, and time.  Skin: Skin is warm and dry.    Data Reviewed No new data.  Assessment    Resolution of chest wall infection.    Plan    The patient was asked  contact the office should she have any resurgence of erythema or pain. Follow up otherwise will be in one week.       Earline Mayotte 01/18/2013, 10:26 AM

## 2013-01-26 ENCOUNTER — Encounter: Payer: Self-pay | Admitting: General Surgery

## 2013-01-26 ENCOUNTER — Ambulatory Visit (INDEPENDENT_AMBULATORY_CARE_PROVIDER_SITE_OTHER): Payer: Self-pay | Admitting: General Surgery

## 2013-01-26 VITALS — BP 130/64 | HR 78 | Resp 14 | Ht 62.0 in | Wt 154.0 lb

## 2013-01-26 DIAGNOSIS — C50919 Malignant neoplasm of unspecified site of unspecified female breast: Secondary | ICD-10-CM

## 2013-01-26 DIAGNOSIS — T814XXA Infection following a procedure, initial encounter: Secondary | ICD-10-CM

## 2013-01-26 NOTE — Progress Notes (Signed)
Patient ID: Erin Roberts, female   DOB: [birth year], 62 y.o.   MRN: [id number]  Chief Complaint  Patient presents with  . Routine Post Op    1 week post op bilateral mastectomy    HPI Erin Roberts is a 62 y.o. female who presents for a 1 month post op bilateral mastectomy. The procedure was performed on 12/28/12.  The patient has had no fever or chills since her last visit. No chest wall discomfort.  HPI  Past Medical History  Diagnosis Date  . Breast cancer 1990  . Carpal tunnel syndrome 1990's  . Hypertension 2013  . Malignant neoplasm of breast (female), unspecified site     Right  . Tendonitis 1990's  . Fat necrosis of breast 2007    Left  . Barrett's esophagus 2008  . Colon polyps 2006    Past Surgical History  Procedure Laterality Date  . Colonoscopy  March 2014    4 rectal polyps, one tubular adenoma. Followup exam due in 2019.  . Breast reduction surgery  2004  . Abdominal hysterectomy  1997  . Cholecystectomy  1970  . Tonsillectomy  1967  . Appendectomy  1970  . Carpal tunnel release  1998, 2004  . Breast surgery    . Breast surgery Bilateral     mastectomy    Family History  Problem Relation Age of Onset  . Colon cancer Father 45  . Colon cancer Mother 51  . Colon cancer Sister 61    Social History History  Substance Use Topics  . Smoking status: Never Smoker   . Smokeless tobacco: Never Used  . Alcohol Use: Yes    Allergies  Allergen Reactions  . Codeine     REACTION: nausea/vomiting    Current Outpatient Prescriptions  Medication Sig Dispense Refill  . omeprazole (PRILOSEC) 20 MG capsule Take 1 capsule by mouth daily.      . simvastatin (ZOCOR) 40 MG tablet Take 1 tablet by mouth daily.      Marland Kitchen sulfamethoxazole-trimethoprim (BACTRIM DS) 800-160 MG per tablet Take 1 tablet by mouth 2 (two) times daily.  14 tablet  1  . triamterene-hydrochlorothiazide (MAXZIDE-25) 37.5-25 MG per tablet Take 1 tablet by mouth daily.      Marland Kitchen venlafaxine  (EFFEXOR) 75 MG tablet Take 1 tablet by mouth daily.      Marland Kitchen zolpidem (AMBIEN) 10 MG tablet Take 1 tablet by mouth daily.       No current facility-administered medications for this visit.    Review of Systems Review of Systems  Constitutional: Negative.   Respiratory: Negative.   Cardiovascular: Negative.     Blood pressure 130/64, pulse 78, resp. rate 14, height 5\' 2"  (1.575 m), weight 154 lb (69.854 kg).  Physical Exam Physical Exam  Constitutional: She is oriented to person, place, and time. She appears well-developed and well-nourished.  Pulmonary/Chest:  Bilateral mastectomy sites look clean and healing well.   Neurological: She is alert and oriented to person, place, and time.  Skin: Skin is warm and dry.    Data Reviewed Culture of the left JP drain had shown MSSA. Aspiration of the right mastectomy site had not shown evidence of infection.  Assessment    Doing well status post bilateral mastectomy, residual soft tissue swelling on the left side. No evidence of residual infection.     Plan    I recommended that she delayed fitting for prostheses for about a month as some of the residual swelling on the left  we'll go down and she'll get a better fit.   The patient had an ER/PR positive, 4 mm tumor. She may well benefit from antiestrogen therapy.  We'll review this at her next visit.       Earline Mayotte 01/26/2013, 9:19 PM

## 2013-01-26 NOTE — Patient Instructions (Addendum)
Patient to return in 1 month. 

## 2013-01-28 ENCOUNTER — Other Ambulatory Visit: Payer: Self-pay | Admitting: General Surgery

## 2013-01-28 NOTE — Progress Notes (Signed)
Patient ID: Erin Roberts, female   DOB: [birth year], 62 y.o.   MRN: [id number] The patient's case was presented at the [facility] tumor board this afternoon.  No indication for adjuvant aromatase inhibitor as very low risk of metastatic disease with present tumor.  Considering young age at original diagnosis, patient could be candidate for BRCA testing.

## 2013-03-03 ENCOUNTER — Encounter: Payer: Self-pay | Admitting: General Surgery

## 2013-03-03 ENCOUNTER — Ambulatory Visit (INDEPENDENT_AMBULATORY_CARE_PROVIDER_SITE_OTHER): Payer: Self-pay | Admitting: General Surgery

## 2013-03-03 VITALS — BP 122/72 | HR 76 | Resp 14 | Ht 62.0 in | Wt 155.0 lb

## 2013-03-03 DIAGNOSIS — C50911 Malignant neoplasm of unspecified site of right female breast: Secondary | ICD-10-CM

## 2013-03-03 DIAGNOSIS — C50919 Malignant neoplasm of unspecified site of unspecified female breast: Secondary | ICD-10-CM

## 2013-03-03 NOTE — Progress Notes (Signed)
Patient ID: Erin Roberts, female   DOB: 1950/04/03, 62 y.o.   MRN: [id number]  Chief Complaint  Patient presents with  . Follow-up    1 month follow up breast no mammogram    HPI Erin Roberts is a 62 y.o. female who presents for a 2 month follow up exam having undergone bilateral mastectomies. The patient denies any problems at this time.   HPI  Past Medical History  Diagnosis Date  . Breast cancer 1990  . Carpal tunnel syndrome 1990's  . Hypertension 2013  . Malignant neoplasm of breast (female), unspecified site     Right  . Tendonitis 1990's  . Fat necrosis of breast 2007    Left  . Barrett's esophagus 2008  . Colon polyps 2006    Past Surgical History  Procedure Laterality Date  . Colonoscopy  March 2014    4 rectal polyps, one tubular adenoma. Followup exam due in 2019.  . Breast reduction surgery  2004  . Abdominal hysterectomy  1997  . Cholecystectomy  1970  . Tonsillectomy  1967  . Appendectomy  1970  . Carpal tunnel release  1998, 2004  . Breast surgery    . Breast surgery Bilateral     mastectomy    Family History  Problem Relation Age of Onset  . Colon cancer Father 31  . Colon cancer Mother 57  . Colon cancer Sister 66    Social History History  Substance Use Topics  . Smoking status: Never Smoker   . Smokeless tobacco: Never Used  . Alcohol Use: Yes    Allergies  Allergen Reactions  . Codeine     REACTION: nausea/vomiting    Current Outpatient Prescriptions  Medication Sig Dispense Refill  . omeprazole (PRILOSEC) 20 MG capsule Take 1 capsule by mouth daily.      . simvastatin (ZOCOR) 40 MG tablet Take 1 tablet by mouth daily.      Marland Kitchen sulfamethoxazole-trimethoprim (BACTRIM DS) 800-160 MG per tablet Take 1 tablet by mouth 2 (two) times daily.  14 tablet  1  . triamterene-hydrochlorothiazide (MAXZIDE-25) 37.5-25 MG per tablet Take 1 tablet by mouth daily.      Marland Kitchen venlafaxine (EFFEXOR) 75 MG tablet Take 1 tablet by mouth daily.      Marland Kitchen  zolpidem (AMBIEN) 10 MG tablet Take 1 tablet by mouth daily.       No current facility-administered medications for this visit.    Review of Systems Review of Systems  Constitutional: Negative.   Respiratory: Negative.   Cardiovascular: Negative.     Blood pressure 122/72, pulse 76, resp. rate 14, height 5\' 2"  (1.575 m), weight 155 lb (70.308 kg).  Physical Exam Physical Exam  Constitutional: She is oriented to person, place, and time. She appears well-developed and well-nourished.  Pulmonary/Chest:  2 well healed mastectomy sites.   Neurological: She is alert and oriented to person, place, and time.  Skin: Skin is warm and dry.    Data Reviewed The patient's case was presented at [facility] tumor board. She was not felt to be a candidate for aromatase inhibitor as she is status post bilateral mastectomies she had a T1 tumor and anticipated benefit is marginal.  Assessment    Doing well status post bilateral mastectomy.    Plan    A prescription for breast prostheses and surgical bra was provided. Arrangements were made for office exam in 4 months, earlier if she has any concerns.       Earline Mayotte  03/06/2013, 10:49 AM

## 2013-03-03 NOTE — Patient Instructions (Signed)
Patient to return in 4 months for follow up visit.

## 2013-05-31 ENCOUNTER — Telehealth: Payer: Self-pay | Admitting: General Surgery

## 2013-05-31 NOTE — Telephone Encounter (Signed)
I contacted the patient to see if she ever had been tested or been asked have testing for BRCA. She had not. When her original tumor was diagnosed it was before the availability of BRCA testing. When she had her recurrent breast cancer develop last year, it was lost in the shuffle.  The patient has sisters and one son. She herself is previously undergone a total hysterectomy and bilateral ovary removal.  She'll contact the office if she would like to have BRCA testing completed. She may wait until her upcoming appointment in the next month or 2 to reviewed the pros and cons and person.

## 2013-07-07 ENCOUNTER — Encounter: Payer: Self-pay | Admitting: General Surgery

## 2013-07-07 ENCOUNTER — Ambulatory Visit (INDEPENDENT_AMBULATORY_CARE_PROVIDER_SITE_OTHER): Payer: Medicare Other | Admitting: General Surgery

## 2013-07-07 VITALS — BP 130/74 | HR 88 | Resp 12 | Ht 62.0 in | Wt 154.0 lb

## 2013-07-07 DIAGNOSIS — C50919 Malignant neoplasm of unspecified site of unspecified female breast: Secondary | ICD-10-CM

## 2013-07-07 DIAGNOSIS — C50911 Malignant neoplasm of unspecified site of right female breast: Secondary | ICD-10-CM

## 2013-07-07 NOTE — Progress Notes (Signed)
Patient ID: Erin Roberts, female   DOB: 20-May-1950, 63 y.o.   MRN: [id number]  Chief Complaint  Patient presents with  . Follow-up    breast cancer    HPI Erin Roberts is a 63 y.o. female.  Her for her follow up exam having undergone bilateral mastectomies 12-28-12. The patient denies any problems at this time.She is a a candidate for genetic testing secondary to her early age at original diagnosis and strong family history of colon cancer.  She does want to have genetic testing done. She had breast cancer at age 5 and then again at age 8.  HPI  Past Medical History  Diagnosis Date  . Carpal tunnel syndrome 1990's  . Hypertension 2013  . Tendonitis 1990's  . Fat necrosis of breast 2007    Left  . Barrett's esophagus 2008  . Colon polyps 2006  . Breast cancer 1990    infiltrating ductal carcinoma  . Malignant neoplasm of breast (female), unspecified site Oct 2014    Right breast/ invasive mammary    Past Surgical History  Procedure Laterality Date  . Colonoscopy  March 2014    4 rectal polyps, one tubular adenoma. Followup exam due in 2019.  . Breast reduction surgery  2004  . Abdominal hysterectomy  1997  . Cholecystectomy  1970  . Tonsillectomy  1967  . Appendectomy  1970  . Carpal tunnel release  1998, 2004  . Breast surgery Right 1990    infiltrating ductal carcinoma  . Breast surgery Bilateral 12-28-12    mastectomy    Family History  Problem Relation Age of Onset  . Colon cancer Father 76  . Colon cancer Mother 17  . Colon cancer Sister 66    Social History History  Substance Use Topics  . Smoking status: Never Smoker   . Smokeless tobacco: Never Used  . Alcohol Use: Yes    Allergies  Allergen Reactions  . Codeine     REACTION: nausea/vomiting    Current Outpatient Prescriptions  Medication Sig Dispense Refill  . omeprazole (PRILOSEC) 20 MG capsule Take 1 capsule by mouth daily.      . simvastatin (ZOCOR) 40 MG tablet Take 1 tablet by  mouth daily.      Marland Kitchen triamterene-hydrochlorothiazide (MAXZIDE-25) 37.5-25 MG per tablet Take 1 tablet by mouth daily.      Marland Kitchen venlafaxine (EFFEXOR) 75 MG tablet Take 1 tablet by mouth daily.      . Vitamin D, Ergocalciferol, (DRISDOL) 50000 UNITS CAPS capsule Take 50,000 Units by mouth every 7 (seven) days.       Marland Kitchen zolpidem (AMBIEN) 10 MG tablet Take 1 tablet by mouth daily.       No current facility-administered medications for this visit.    Review of Systems Review of Systems  Constitutional: Negative.   Respiratory: Negative.   Cardiovascular: Negative.     Blood pressure 130/74, pulse 88, resp. rate 12, height 5\' 2"  (1.575 m), weight 154 lb (69.854 kg).  Physical Exam Physical Exam  Constitutional: She is oriented to person, place, and time. She appears well-developed and well-nourished.  Neck: Neck supple.  Pulmonary/Chest:  Bilateral mastectomy sites well healed. 3 x 6 cm area of redundant skin on the lateral aspect of the left mastectomy site. Smaller area of the right.  Lymphadenopathy:    She has no cervical adenopathy.    She has no axillary adenopathy.  Neurological: She is alert and oriented to person, place, and time.  Skin: Skin is  warm and dry.       Assessment    Doing well status post bilateral mastectomy. Concerned about the redundant skin laterally.     Plan    I've asked her to wait a year from the original surgery before considering excision of redundant tissue. We'll plan for repeat exam in 6 months. She is aware that the testicle likely take 3 weeks.     Genetic testing completed.   PCP: Dear, Reola Mosher Britnie Colville 07/08/2013, 4:04 PM

## 2013-07-07 NOTE — Patient Instructions (Signed)
Call for any new concerns.

## 2013-07-28 ENCOUNTER — Telehealth: Payer: Self-pay | Admitting: General Surgery

## 2013-07-28 NOTE — Telephone Encounter (Signed)
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 No reported female risk. Copy to be forwarded so she can discuss testing with her sisters.

## 2013-08-20 ENCOUNTER — Encounter: Payer: Self-pay | Admitting: General Surgery

## 2013-08-20 DIAGNOSIS — Z1501 Genetic susceptibility to malignant neoplasm of breast: Secondary | ICD-10-CM

## 2013-08-20 HISTORY — DX: Genetic susceptibility to malignant neoplasm of breast: Z15.01

## 2013-11-11 IMAGING — US US OUTSIDE FILMS BREAST
1 series · 6 of 6 positions shown · non-contrast
Comparison: none

[Series 1: us outside films breast · 6 of 6 slices shown]
[im 1/6]
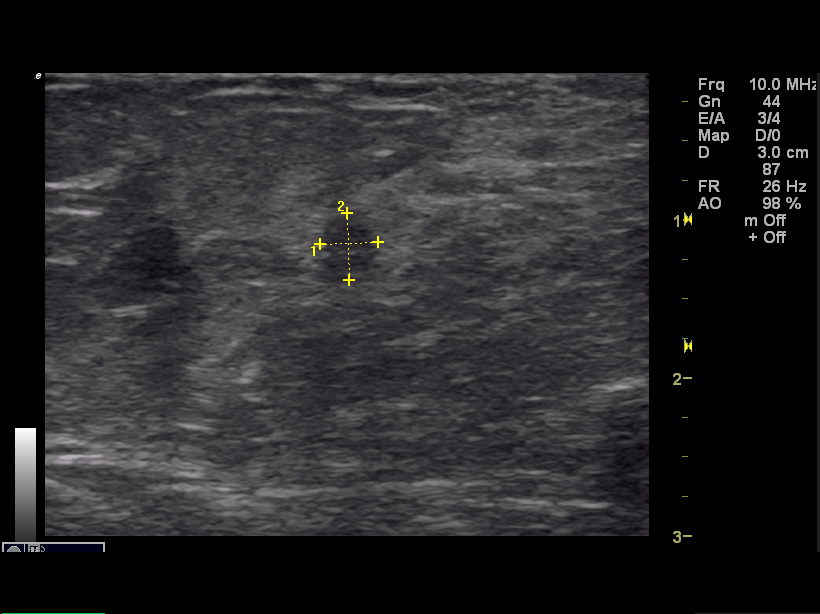
[im 2/6]
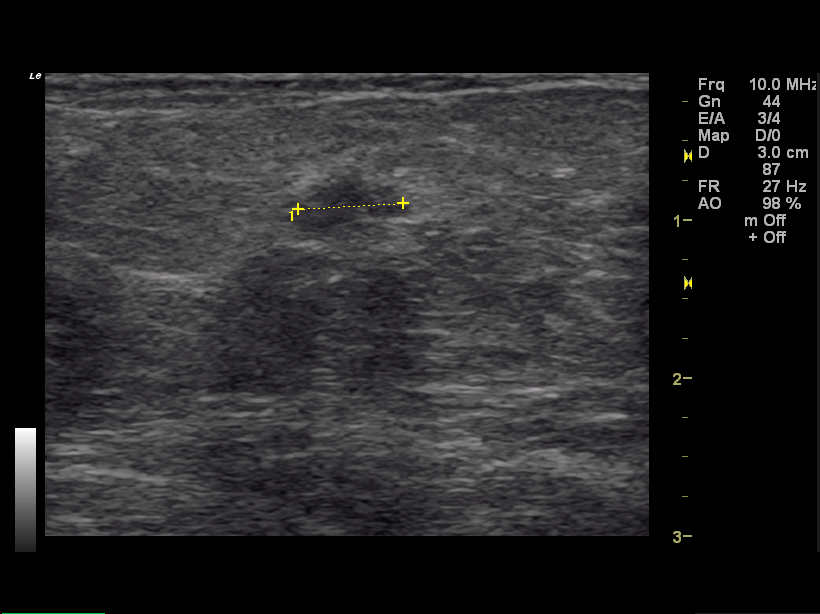
[im 3/6]
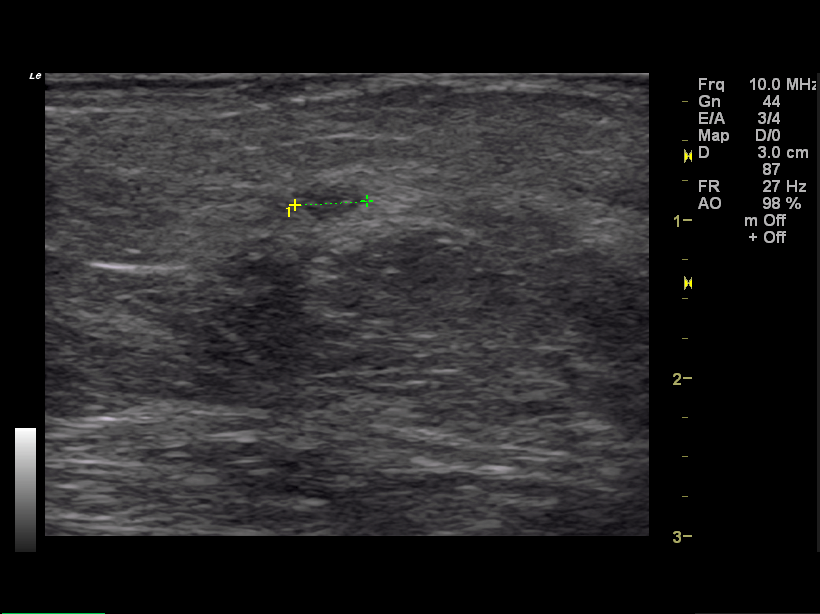
[im 4/6]
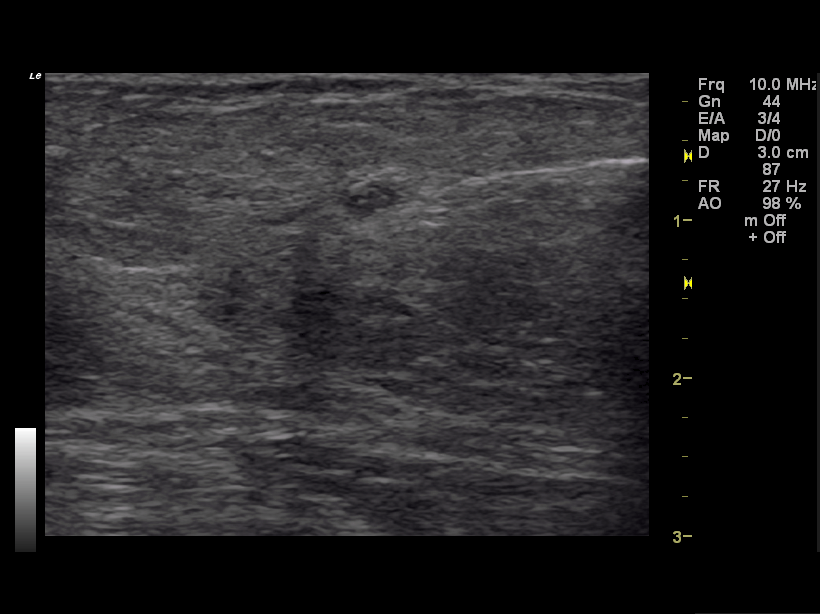
[im 5/6]
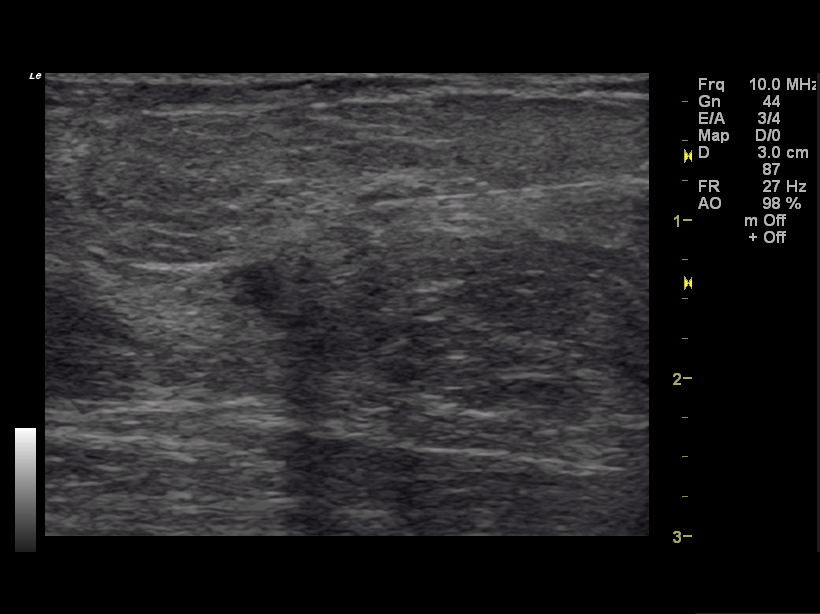
[im 6/6]
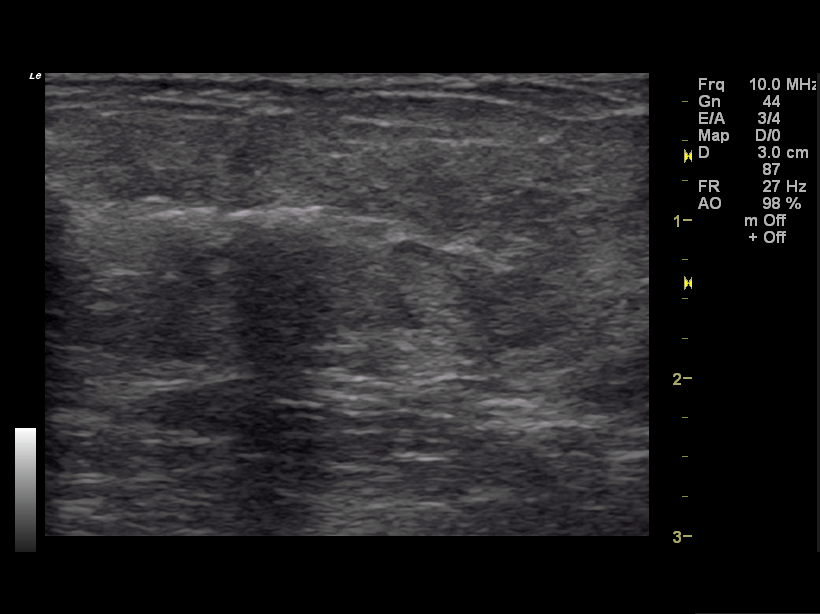

[6 of 6 positions shown; findings below may reference images not displayed]

IMAGES IMPORTED FROM THE [employer] WORKFLOW SYSTEM
NO DICTATION FOR STUDY

## 2014-01-06 ENCOUNTER — Ambulatory Visit: Payer: Medicare Other | Admitting: General Surgery

## 2014-01-17 ENCOUNTER — Encounter: Payer: Self-pay | Admitting: General Surgery

## 2014-01-25 ENCOUNTER — Encounter: Payer: Self-pay | Admitting: General Surgery

## 2014-01-25 ENCOUNTER — Ambulatory Visit (INDEPENDENT_AMBULATORY_CARE_PROVIDER_SITE_OTHER): Payer: Medicare Other | Admitting: General Surgery

## 2014-01-25 VITALS — BP 128/66 | HR 88 | Resp 12 | Ht 62.0 in | Wt 156.0 lb

## 2014-01-25 DIAGNOSIS — Z853 Personal history of malignant neoplasm of breast: Secondary | ICD-10-CM | POA: Insufficient documentation

## 2014-01-25 NOTE — Progress Notes (Signed)
Patient ID: Erin Roberts, female   DOB: [birth year], 63 y.o.   MRN: [id number]  Chief Complaint  Patient presents with  . Follow-up    HPI Erin Roberts is a 63 y.o. female.  Here today for her follow up exam. She states she is doing well, no new complaints.  The patient reports that she thought having a bilateral mastectomy would be "easy". She is found the prostheses to be somewhat cumbersome. She broached the subject of possible breast reconstruction. She still troubled by the redundant soft tissue on the left side more so than the right.  HPI  Past Medical History  Diagnosis Date  . Carpal tunnel syndrome 1990's  . Hypertension 2013  . Tendonitis 1990's  . Fat necrosis of breast 2007    Left  . Barrett's esophagus 2008  . Colon polyps 2006  . Breast cancer 1990    Infiltrating ductal carcinoma  . Malignant neoplasm of breast (female), unspecified site Oct 2014    , 4 mm. T1a,Nx; ER 90%, PR 40%, HER-2/neu not amplified. No indication for antiestrogen therapy after [facility] tumor board Presentation.    Past Surgical History  Procedure Laterality Date  . Colonoscopy  March 2014    4 rectal polyps, one tubular adenoma. Followup exam due in 2019.  . Breast reduction surgery  2004  . Abdominal hysterectomy  1997  . Cholecystectomy  1970  . Tonsillectomy  1967  . Appendectomy  1970  . Carpal tunnel release  1998, 2004  . Breast surgery Right 1990    infiltrating ductal carcinoma  . Breast surgery Bilateral 12-28-12    mastectomy    Family History  Problem Relation Age of Onset  . Colon cancer Father 53  . Colon cancer Mother 31  . Colon cancer Sister 75    Social History History  Substance Use Topics  . Smoking status: Never Smoker   . Smokeless tobacco: Never Used  . Alcohol Use: Yes    Allergies  Allergen Reactions  . Codeine     REACTION: nausea/vomiting    Current Outpatient Prescriptions  Medication Sig Dispense Refill  . omeprazole (PRILOSEC)  20 MG capsule Take 1 capsule by mouth daily.    . simvastatin (ZOCOR) 40 MG tablet Take 1 tablet by mouth daily.    Marland Kitchen triamterene-hydrochlorothiazide (MAXZIDE-25) 37.5-25 MG per tablet Take 1 tablet by mouth daily.    Marland Kitchen venlafaxine (EFFEXOR) 75 MG tablet Take 1 tablet by mouth daily.    . Vitamin D, Ergocalciferol, (DRISDOL) 50000 UNITS CAPS capsule Take 50,000 Units by mouth every 7 (seven) days.     Marland Kitchen zolpidem (AMBIEN) 10 MG tablet Take 1 tablet by mouth daily.     No current facility-administered medications for this visit.    Review of Systems Review of Systems  Constitutional: Negative.   Respiratory: Negative.   Cardiovascular: Negative.     Blood pressure 128/66, pulse 88, resp. rate 12, height $RemoveBe'5\' 2"'upsNQsmKk$  (1.575 m), weight 156 lb (70.761 kg).  Physical Exam Physical Exam  Constitutional: She is oriented to person, place, and time. She appears well-developed and well-nourished.  Neck: Neck supple.  Cardiovascular: Normal rate, regular rhythm and normal heart sounds.   Pulmonary/Chest: Effort normal and breath sounds normal.  Redundant skin left > right.  1 cm mole below left clavicle. 4 x 7 cm right lateral chest wall fat pad and a 6 x 10 cm left lateral chest wall fat pad. Bilateral chest wall mastectomy sites well healed.  Lymphadenopathy:    She has no cervical adenopathy.    She has no axillary adenopathy.  Neurological: She is alert and oriented to person, place, and time.  Skin: Skin is warm and dry.      Assessment    No evidence of chest wall or regional recurrence of her right breast cancer.  Redundant skin left greater than right status post bilateral mastectomy.     Plan    Options for his excisional therapy were reviewed. In light of her concerns regarding the use of external prostheses have recommended that she discuss her options for reconstruction with the plastic surgery service. Considering on the right side she had received chest wall radiation this may  change some options open to her.  Unless there are new issues, we'll plan for follow-up examination in one year.     Follow up in one year.  Appointment with Dr. Nicholaus Bloom to discuss reconstruction.   PCP:  Lennie Muckle 01/25/2014, 8:23 PM

## 2014-01-25 NOTE — Patient Instructions (Signed)
The patient is aware to call back for any questions or concerns.  

## 2014-03-28 ENCOUNTER — Encounter: Payer: Self-pay | Admitting: Internal Medicine

## 2014-05-01 IMAGING — CR RIGHT ANKLE - COMPLETE 3+ VIEW
1 series · 5 of 5 positions shown · non-contrast
Comparison: none

REASON FOR EXAM: ankle pain
COMMENTS:

PROCEDURE:     KDR - KDXR ANKLE RIGHT COMPLETE  - March 30, 2012 [DATE]
RESULT:     Comparison: None

[Series 1: ap · 0.17mm/px · 5 of 5 slices shown]
[im 1/5]
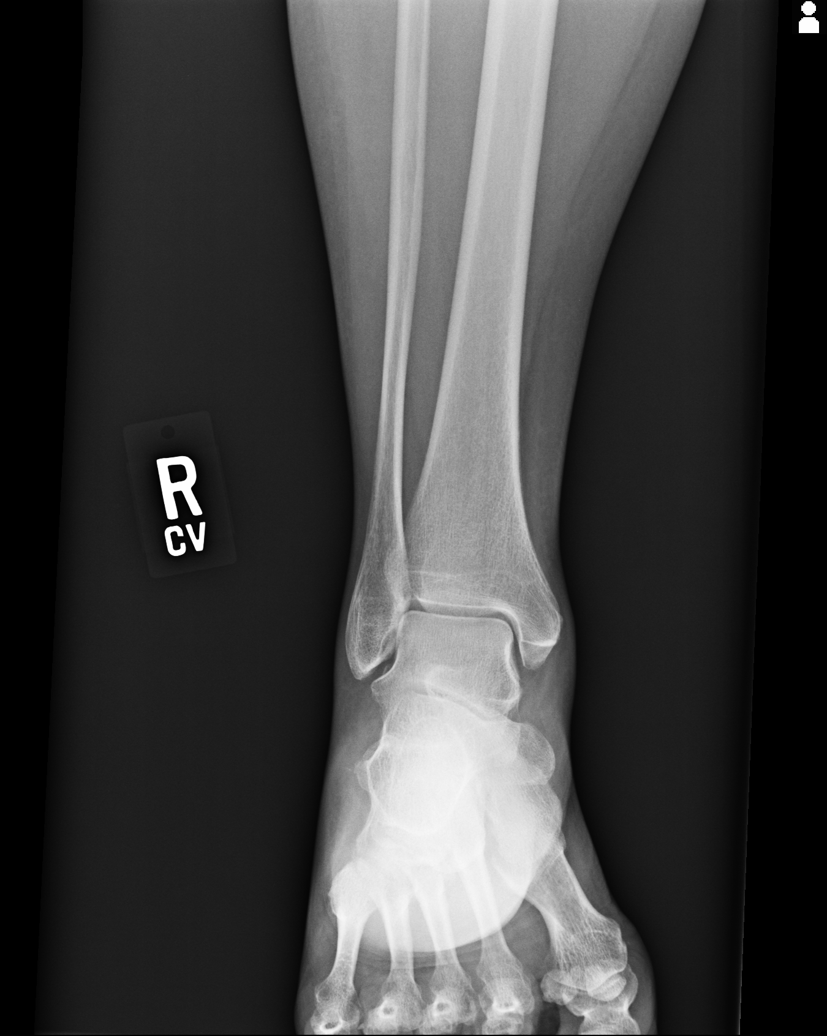
[im 2/5]
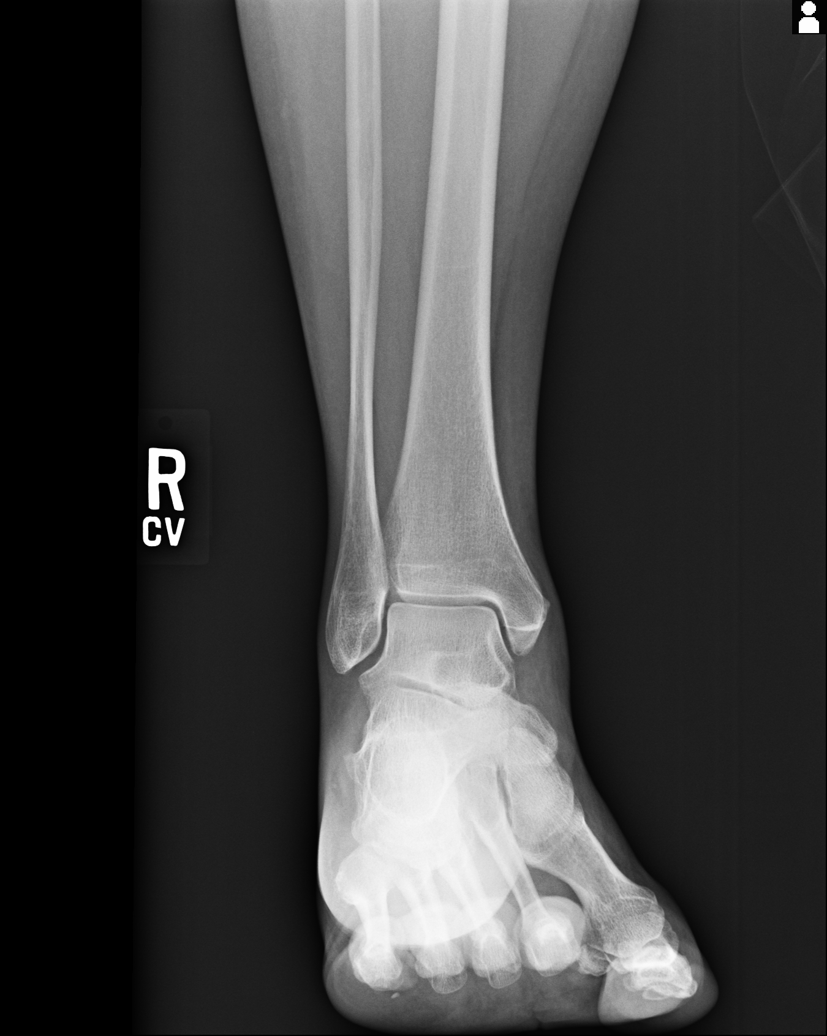
[im 3/5]
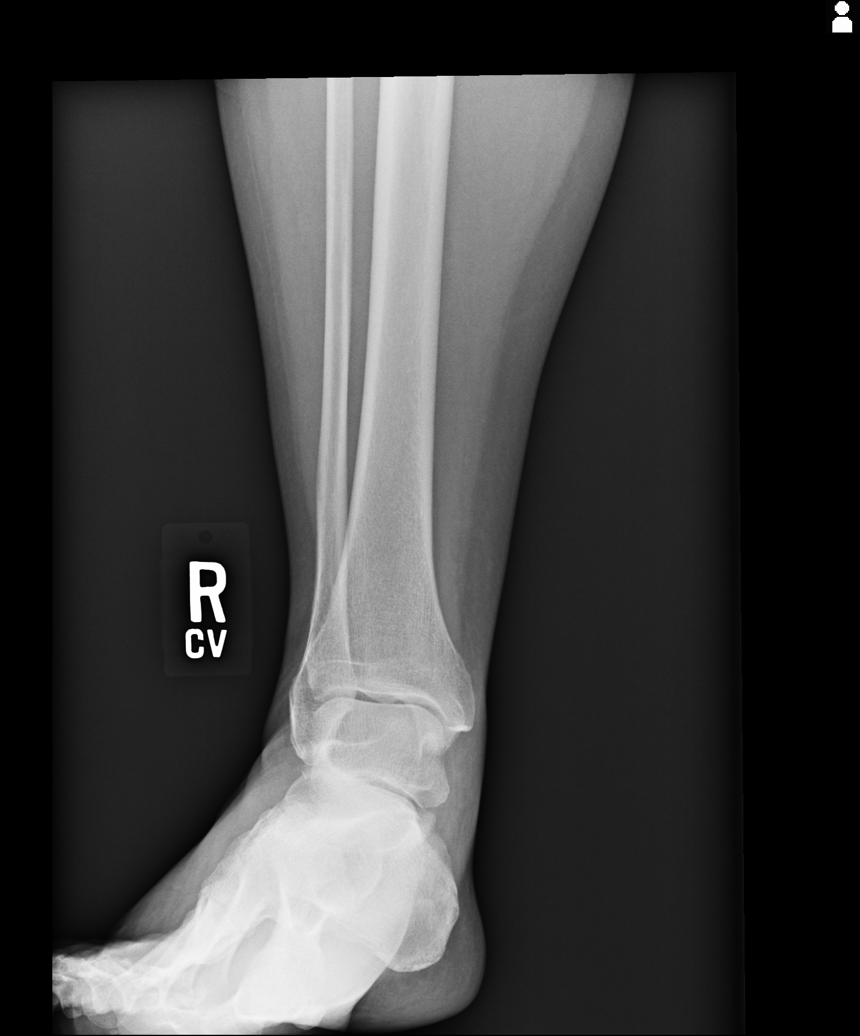
[im 4/5]
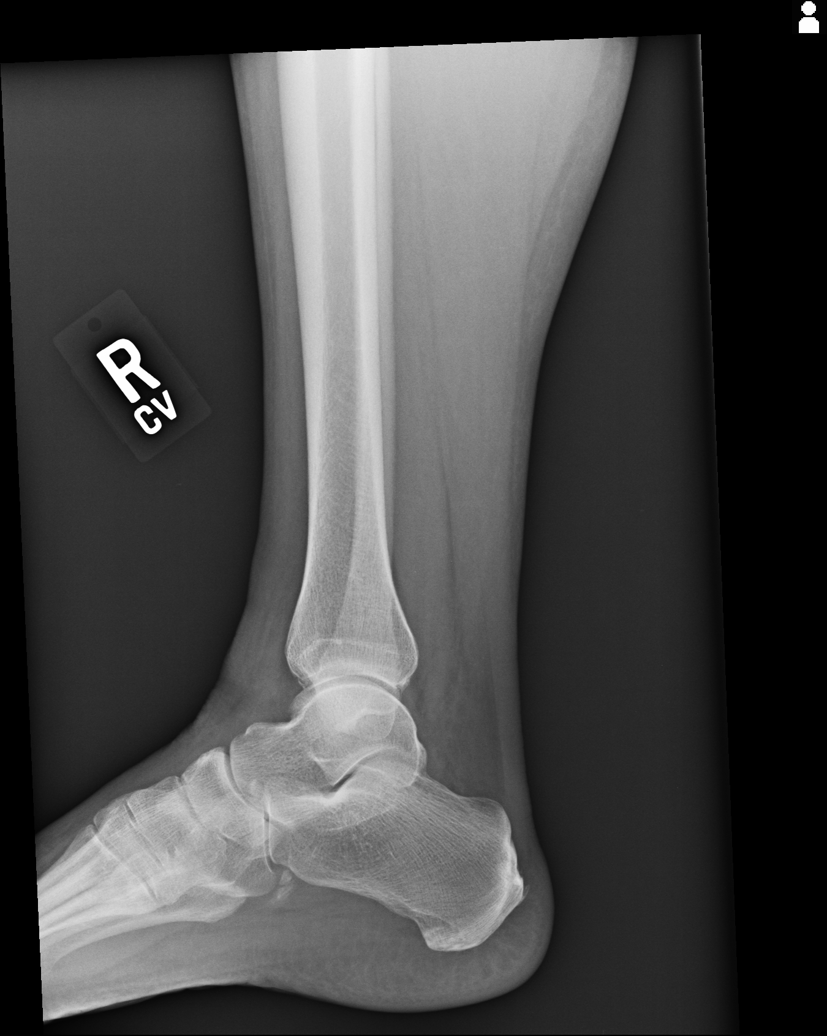
[im 5/5]
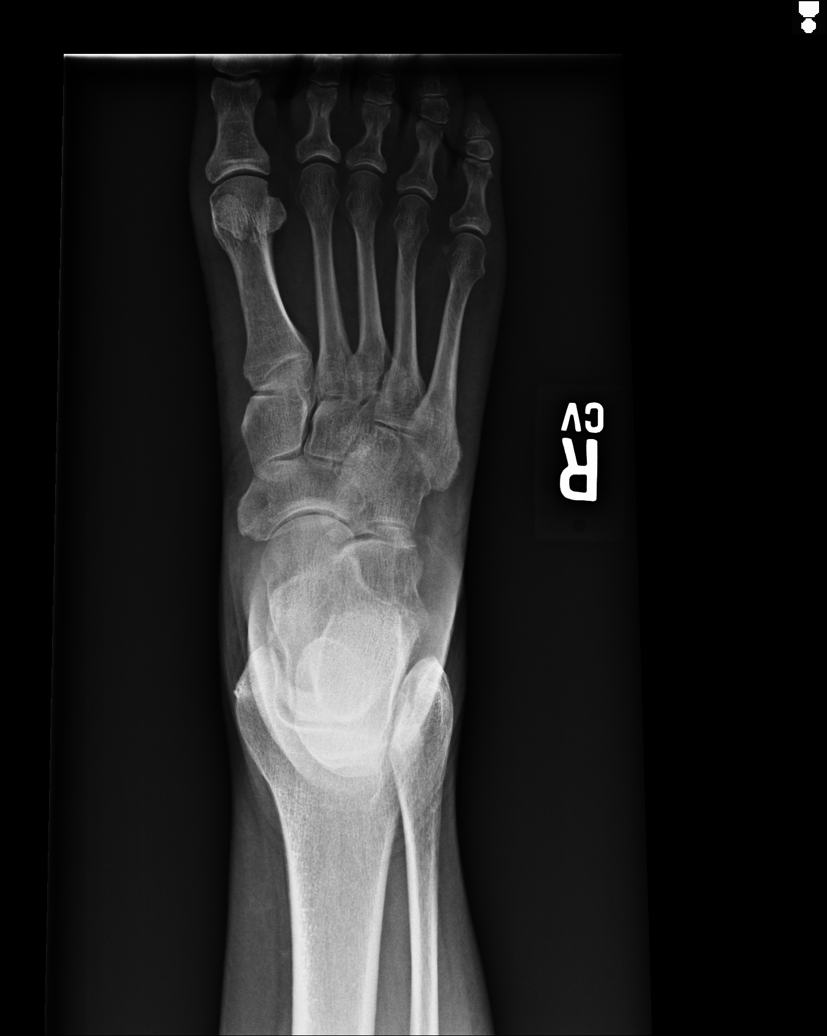

[5 of 5 positions shown; findings below may reference images not displayed]

FINDINGS: 5 views of the right ankle demonstrate a tiny sliver of bone adjacent to the
lateral talus likely representing sequela of avulsive injury; correlate with
point tenderness. There is no other fracture or dislocation. There ankle
mortise is intact. There is no significant joint effusion. The soft tissues
are normal.
IMPRESSION: Please see above.

[REDACTED]

## 2014-07-08 NOTE — Op Note (Signed)
PATIENT NAME:  Erin Roberts, Erin Roberts MR#:  [id number] DATE OF BIRTH:  09-05-1950  DATE OF PROCEDURE:  12/28/2012  PREOPERATIVE DIAGNOSIS: Recurrent right breast cancer, desire for bilateral mastectomy.   POSTOPERATIVE DIAGNOSIS: Recurrent right breast cancer, desire for bilateral mastectomy.   OPERATIVE PROCEDURE: Bilateral mastectomy.   SURGEON: Hervey Ard, MD.   ANESTHESIA: General by LMA under Dr. Andree Elk.   ESTIMATED BLOOD LOSS: 100 mL.  FLUID REPLACEMENT: Crystalloid.   CLINICAL NOTE: This 64 year old woman had right breast cancer at age 53. She underwent partial mastectomy, axillary dissection, chemotherapy and postoperative radiation. Recent mammogram showed an interval change and a 3 mm infiltrating mammary carcinoma was identified. She desired to proceed to bilateral mastectomy.   OPERATIVE NOTE: With the patient under adequate general anesthesia, the area was prepped with ChloraPrep and draped. Elliptical incisions were outlined bilaterally. The right breast was approached first. The skin was incised sharply and the remaining dissection completed with electrocautery. Hemostasis was with 3-0 Vicryl ties, 3-0 Vicryl suture ligatures and electrocautery. The breast was elevated from the underlying pectoralis muscle taking the fascia of that muscle with the specimen. Margins of resection were the clavicle superiorly, rectus fascia inferiorly, sternum medially and serratus muscle laterally. The breast was sent fresh to pathology per protocol. A Blake drain was brought out through the inferior medial flap and anchored in place with 3-0 nylon. The wound was irrigated with water and good hemostasis appreciated. The skin was approximated with running 2-0 Vicryl deep dermal suture in 2 segments.   Surgeon's gloves were changed and attention turned to the left breast. A similar elliptical incision was used and the same margins for resection. The breast was excised and good hemostasis achieved. The  wound was irrigated. Blake drain placed. The skin was approximated again with a running 2-0 Vicryl deep dermal suture. Benzoin and Steri-Strips followed by Telfa, fluffed gauze, Kerlix and an Ace wrap was applied. The patient tolerated the procedure well and was taken to the recovery room in stable condition.  ____________________________ Robert Bellow, MD jwb:aw D: 12/28/2012 10:24:35 ET T: 12/28/2012 10:35:23 ET JOB#: [id number]  cc: Robert Bellow, MD, <Dictator> Cedar Ditullio Amedeo Kinsman MD ELECTRONICALLY SIGNED 12/30/2012 8:41

## 2014-11-07 ENCOUNTER — Ambulatory Visit: Payer: Self-pay | Admitting: Nurse Practitioner

## 2014-11-07 ENCOUNTER — Ambulatory Visit (INDEPENDENT_AMBULATORY_CARE_PROVIDER_SITE_OTHER): Payer: Self-pay | Admitting: Nurse Practitioner

## 2014-11-07 ENCOUNTER — Encounter (INDEPENDENT_AMBULATORY_CARE_PROVIDER_SITE_OTHER): Payer: Self-pay

## 2014-11-07 ENCOUNTER — Encounter: Payer: Self-pay | Admitting: Nurse Practitioner

## 2014-11-07 VITALS — BP 138/76 | HR 95 | Temp 98.2°F | Resp 16 | Ht 62.0 in | Wt 154.4 lb

## 2014-11-07 DIAGNOSIS — Z7189 Other specified counseling: Secondary | ICD-10-CM | POA: Diagnosis not present

## 2014-11-07 DIAGNOSIS — K2270 Barrett's esophagus without dysplasia: Secondary | ICD-10-CM

## 2014-11-07 DIAGNOSIS — K625 Hemorrhage of anus and rectum: Secondary | ICD-10-CM

## 2014-11-07 DIAGNOSIS — Z7689 Persons encountering health services in other specified circumstances: Secondary | ICD-10-CM

## 2014-11-07 DIAGNOSIS — F411 Generalized anxiety disorder: Secondary | ICD-10-CM

## 2014-11-07 LAB — CBC WITH DIFFERENTIAL/PLATELET
BASOS ABS: 0 10*3/uL (ref 0.0–0.1)
BASOS PCT: 0.6 % (ref 0.0–3.0)
Eosinophils Absolute: 0.6 10*3/uL (ref 0.0–0.7)
Eosinophils Relative: 9.7 % — ABNORMAL HIGH (ref 0.0–5.0)
HEMATOCRIT: 35.5 % — AB (ref 36.0–46.0)
HEMOGLOBIN: 12 g/dL (ref 12.0–15.0)
LYMPHS PCT: 38.5 % (ref 12.0–46.0)
Lymphs Abs: 2.3 10*3/uL (ref 0.7–4.0)
MCHC: 33.7 g/dL (ref 30.0–36.0)
MCV: 94.4 fl (ref 78.0–100.0)
Monocytes Absolute: 0.5 10*3/uL (ref 0.1–1.0)
Monocytes Relative: 8.2 % (ref 3.0–12.0)
NEUTROS ABS: 2.6 10*3/uL (ref 1.4–7.7)
Neutrophils Relative %: 43 % (ref 43.0–77.0)
PLATELETS: 205 10*3/uL (ref 150.0–400.0)
RBC: 3.76 Mil/uL — ABNORMAL LOW (ref 3.87–5.11)
RDW: 14.4 % (ref 11.5–15.5)
WBC: 6 10*3/uL (ref 4.0–10.5)

## 2014-11-07 NOTE — Progress Notes (Signed)
Pre visit review using our clinic review tool, if applicable. No additional management support is needed unless otherwise documented below in the visit note. 

## 2014-11-07 NOTE — Patient Instructions (Signed)
Please visit the lab today for a complete blood count and we will get you set up today with your referral.   Welcome to [place]!

## 2014-11-07 NOTE — Progress Notes (Signed)
Patient ID: Erin Roberts, female    DOB: 24-Mar-1950  Age: 64 y.o. MRN: [id number]  CC: Establish Care   HPI Erin Roberts presents for establish care today and chief complaint of rectal bleeding.   1) New pt info:   Immunizations- Unknown; declines flu   Mammogram- 2014; [facility]  Pap- Hysterectomy   Bone Density- Has had per pt 5 years or so ago   Colonoscopy- 2014; polyps   Eye Exam- 2014; has glasses, no future one scheduled   Dental Exam- UTD  2) Chronic Problems-  Breast Cancer- Seeing Dr. Bary Castilla yearly  Depression- Effexor helpful, stable   GERD- EGD in past stable on prilosec   Ambien- takes half, takes most nights, last dose last night   3) Acute Problems-  Dr. Eliberto Ivory- Colonoscopy in 2014 and EGD in 2014  Pt has had more blood in the toilet, "right much" amount, more frequently recently, experienced yesterday with her 1 BM (soft stool and darker colored).   History Erin Roberts has a past medical history of Carpal tunnel syndrome (1990's); Hypertension (2013); Tendonitis (1990's); Fat necrosis of breast (2007); Barrett's esophagus (2008); Colon polyps (2006); Breast cancer (1990); Malignant neoplasm of breast (female), unspecified site (Oct 2014); Chicken pox; Depression; GERD (gastroesophageal reflux disease); and Hyperlipidemia.   She has past surgical history that includes Colonoscopy (March 2014); Breast reduction surgery (2004); Abdominal hysterectomy (1997); Cholecystectomy (1970); Tonsillectomy (1967); Appendectomy (1970); Carpal tunnel release (1998, 2004); Breast surgery (Right, 1990); Breast surgery (Bilateral, 12-28-12); kidney biospy; and Mastectomy (2014).   Her family history includes Colon cancer (age of onset: 94) in her sister; Colon cancer (age of onset: 66) in her mother; Colon cancer (age of onset: 46) in her father; Diabetes in her brother and sister; Hyperlipidemia in her mother; Hypertension in her mother; Kidney disease in her  mother.She reports that she has never smoked. She has never used smokeless tobacco. She reports that she drinks about 2.4 oz of alcohol per week. She reports that she does not use illicit drugs.  Outpatient Prescriptions Prior to Visit  Medication Sig Dispense Refill  . omeprazole (PRILOSEC) 20 MG capsule Take 1 capsule by mouth daily.    . simvastatin (ZOCOR) 40 MG tablet Take 1 tablet by mouth daily.    Marland Kitchen triamterene-hydrochlorothiazide (MAXZIDE-25) 37.5-25 MG per tablet Take 1 tablet by mouth daily.    Marland Kitchen venlafaxine (EFFEXOR) 75 MG tablet Take 1 tablet by mouth daily.    Marland Kitchen zolpidem (AMBIEN) 10 MG tablet Take 1 tablet by mouth daily.    . Vitamin D, Ergocalciferol, (DRISDOL) 50000 UNITS CAPS capsule Take 50,000 Units by mouth every 7 (seven) days.      No facility-administered medications prior to visit.    ROS Review of Systems  Constitutional: Negative for fever, chills, diaphoresis and fatigue.  Respiratory: Negative for chest tightness, shortness of breath and wheezing.   Cardiovascular: Negative for chest pain, palpitations and leg swelling.  Gastrointestinal: Positive for blood in stool and anal bleeding. Negative for nausea, vomiting and diarrhea.  Neurological: Negative for dizziness, weakness, numbness and headaches.  Psychiatric/Behavioral: Positive for sleep disturbance. Negative for suicidal ideas. The patient is nervous/anxious.    Objective:  BP 138/76 mmHg  Pulse 95  Temp(Src) 98.2 F (36.8 C)  Resp 16  Ht 5\' 2"  (1.575 m)  Wt 154 lb 6.4 oz (70.035 kg)  BMI 28.23 kg/m2  SpO2 97%  Physical Exam  Constitutional: She is oriented to person, place, and time. She  appears well-developed and well-nourished. No distress.  HENT:  Head: Normocephalic and atraumatic.  Right Ear: External ear normal.  Left Ear: External ear normal.  Cardiovascular: Normal rate, regular rhythm and normal heart sounds.   Pulmonary/Chest: Effort normal and breath sounds normal. No respiratory  distress. She has no wheezes. She has no rales. She exhibits no tenderness.  Genitourinary: Guaiac positive stool.  Hemoccult-positive  Neurological: She is alert and oriented to person, place, and time. No cranial nerve deficit. She exhibits normal muscle tone. Coordination normal.  Skin: Skin is warm and dry. No rash noted. She is not diaphoretic.  Psychiatric: She has a normal mood and affect. Her behavior is normal. Judgment and thought content normal.   Assessment & Plan:   [name] was seen today for establish care.  Diagnoses and all orders for this visit:  Rectal bleeding -     CBC w/Diff -     Ambulatory referral to Gastroenterology  Encounter to establish care  BARRETTS ESOPHAGUS  Generalized anxiety disorder   I have discontinued Ms. Fendrick's Vitamin D (Ergocalciferol). I am also having her maintain her simvastatin, omeprazole, venlafaxine, zolpidem, and triamterene-hydrochlorothiazide.  No orders of the defined types were placed in this encounter.     Follow-up: Return in about 3 months (around 02/07/2015).

## 2014-11-17 DIAGNOSIS — K625 Hemorrhage of anus and rectum: Secondary | ICD-10-CM | POA: Insufficient documentation

## 2014-11-17 DIAGNOSIS — Z7689 Persons encountering health services in other specified circumstances: Secondary | ICD-10-CM | POA: Insufficient documentation

## 2014-11-17 NOTE — Assessment & Plan Note (Signed)
Patient was referred to gastroenterology urgently for rectal bleeding. Patient has history of hemorrhoids. Patient reports this is worsening. Will obtain CBC with differential today.

## 2014-11-17 NOTE — Assessment & Plan Note (Signed)
Patient stable on Prilosec. Will follow.

## 2014-11-17 NOTE — Assessment & Plan Note (Signed)
Patient stable on Effexor. Patient's insomnia is also stable on Ambien.

## 2014-11-17 NOTE — Assessment & Plan Note (Signed)
Discussed acute and chronic issues. Reviewed health maintenance measures, PFSHx, and immunizations. Obtain records from previous facility.   

## 2014-11-22 ENCOUNTER — Other Ambulatory Visit: Payer: Self-pay | Admitting: Nurse Practitioner

## 2014-11-22 NOTE — Telephone Encounter (Signed)
Last OV 8.22.16.  These meds have not been prescribed by you.  Please advise

## 2014-11-22 NOTE — Telephone Encounter (Signed)
rx faxed

## 2015-01-06 ENCOUNTER — Other Ambulatory Visit: Payer: Self-pay | Admitting: Nurse Practitioner

## 2015-01-06 NOTE — Telephone Encounter (Signed)
Last IV 8.22.16, next OV 11.8.16. Please advise refill

## 2015-01-24 ENCOUNTER — Ambulatory Visit (INDEPENDENT_AMBULATORY_CARE_PROVIDER_SITE_OTHER): Payer: Medicare Other | Admitting: General Surgery

## 2015-01-24 ENCOUNTER — Encounter: Payer: Self-pay | Admitting: General Surgery

## 2015-01-24 VITALS — BP 150/68 | HR 84 | Resp 14 | Ht 62.0 in | Wt 150.0 lb

## 2015-01-24 DIAGNOSIS — Z853 Personal history of malignant neoplasm of breast: Secondary | ICD-10-CM | POA: Diagnosis not present

## 2015-01-24 DIAGNOSIS — L987 Excessive and redundant skin and subcutaneous tissue: Secondary | ICD-10-CM

## 2015-01-24 NOTE — Progress Notes (Signed)
Patient ID: Erin Roberts, female   DOB: 09-25-1950, 64 y.o.   MRN: [id number]  Chief Complaint  Patient presents with  . Follow-up    HPI Erin Roberts is a 64 y.o. female.  Here today for her follow up breast cancer exam. She states she is doing well. Patient states she has been seeming blood on the paper and in the bowl. She see blood about every four days, not associated with any pain.  The patient is feeling well, but reports local discomfort from the redundant skin at the lateral aspect of the left more so than the right mastectomy incision.  I personally reviewed the history with the patient.   HPI  Past Medical History  Diagnosis Date  . Carpal tunnel syndrome 1990's  . Hypertension 2013  . Tendonitis 1990's  . Fat necrosis of breast 2007    Left  . Barrett's esophagus 2008  . Colon polyps 2006  . Breast cancer ([facility]) 1990    Infiltrating ductal carcinoma  . Malignant neoplasm of breast (female), unspecified site Oct 2014    , 4 mm. T1a,Nx; ER 90%, PR 40%, HER-2/neu not amplified. No indication for antiestrogen therapy after [facility] tumor board Presentation.  . Chicken pox   . Depression   . GERD (gastroesophageal reflux disease)   . Hyperlipidemia     Past Surgical History  Procedure Laterality Date  . Colonoscopy  March 2014    4 rectal polyps, one tubular adenoma. Followup exam due in 2019.  . Breast reduction surgery  2004  . Abdominal hysterectomy  1997  . Cholecystectomy  1970  . Tonsillectomy  1967  . Appendectomy  1970  . Carpal tunnel release  1998, 2004  . Breast surgery Right 1990    infiltrating ductal carcinoma  . Breast surgery Bilateral 12-28-12    mastectomy  . Kidney biospy    . Mastectomy  2014  . Upper gi endoscopy  February 2014    No Barrett's epithelium reported.    Family History  Problem Relation Age of Onset  . Colon cancer Father 3  . Colon cancer Mother 73  . Hyperlipidemia Mother   . Hypertension Mother   . Kidney  disease Mother   . Colon cancer Sister 70  . Diabetes Sister   . Diabetes Brother     Social History Social History  Substance Use Topics  . Smoking status: Never Smoker   . Smokeless tobacco: Never Used  . Alcohol Use: 2.4 oz/week    4 Glasses of wine per week    Allergies  Allergen Reactions  . Codeine     REACTION: nausea/vomiting    Current Outpatient Prescriptions  Medication Sig Dispense Refill  . omeprazole (PRILOSEC) 20 MG capsule TAKE ONE CAPSULE TWICE A DAY 180 capsule 1  . simvastatin (ZOCOR) 40 MG tablet TAKE ONE TABLET EVERY EVENING 90 tablet 1  . triamterene-hydrochlorothiazide (MAXZIDE-25) 37.5-25 MG per tablet TAKE ONE-HALF TABLET DAILY 45 tablet 1  . venlafaxine (EFFEXOR) 75 MG tablet TAKE ONE TABLET TWICE A DAY WITH FOOD 180 tablet 1  . zolpidem (AMBIEN) 10 MG tablet TAKE ONE-HALF TABLET AT BEDTIME 30 tablet 0   No current facility-administered medications for this visit.    Review of Systems Review of Systems  Constitutional: Negative.   Respiratory: Negative.   Cardiovascular: Negative.     Blood pressure 150/68, pulse 84, resp. rate 14, height _0  (1.575 m), weight 150 lb (68.04 kg).  Physical Exam Physical Exam  Constitutional: She is oriented to person, place, and time. She appears well-developed.  Eyes: Conjunctivae are normal. No scleral icterus.  Neck: Neck supple.  Cardiovascular: Normal rate, regular rhythm and normal heart sounds.   Pulmonary/Chest:    Chest wall is otherwise free of abnormality. No evidence of local regional recurrence.  Abdominal: Soft. Normal appearance. There is no tenderness.  Genitourinary: Rectal exam shows no mass and anal tone normal. Internal hemorrhoid: right anterior, 1 cm.  Anoscopy notable only for right anterior internal hemorrhoid.  Lymphadenopathy:    She has no cervical adenopathy.    She has no axillary adenopathy.  Neurological: She is alert and oriented to person, place, and time.  Skin:  Skin is warm and dry.    Data Reviewed Collection Date: 05/08/12 Accession Date: 05/11/12 Pathology Number: [id number] ___________________________________________________________________________  ======CLINICAL HISTORY====== ICD-9 CODE: 530.85, 530.89, 537.9, 211.1 Biopsies  ===GROSS EXAMINATION=== A. "Gastric, r/o Helicobacter pylori", in formalin. Received are tan tissue fragments with an aggregate dimension of 0.8 x 0.5 x 0.2 cm. In toto in A1.  B. "Gastric polyp", in formalin. Received are two tan tissue fragments each 0.3 in greatest dimension. In toto in B1.  C. "Esophagus, Z-line", in formalin. Received are three tan tissue fragments each 0.2 to 0.3 cm in greatest dimension. In toto in C1. FS:ptw  ===MICROSCOPIC EXAMINATION=== Microscopic examination is performed.  ===DIAGNOSIS=== A. GASTRIC MUCOSA, ENDOSCOPIC BIOPSY:  GASTRIC MUCOSA WITH NO SPECIFIC PATHOLOGIC DIAGNOSIS. IMMUNOSTAIN FOR HELICOBACTER IS NEGATIVE. NEGATIVE FOR CARCINOMA, METAPLASIA, OR SIGNIFICANT INFLAMMATION.   B. GASTRIC POLYP, ENDOSCOPIC BIOPSY:  FUNDIC GLAND POLYP, TWO FRAGMENTS. IMMUNOSTAIN FOR HELICOBACTER IS NEGATIVE. NEGATIVE FOR CARCINOMA OR DYSPLASIA.   C. ESOPHAGUS, ENDOSCOPIC BIOPSY:  SQUAMOUS AND GASTRIC-TYPE MUCOSA WITH CHRONIC INFLAMMATION. NEGATIVE FOR CARCINOMA, DYSPLASIA, GOBLET CELL INTESTINAL METAPLASIA, OR ACUTE INFLAMMATION.  RMD:ndd   I certify that I personally evaluated this specimen and have made the above diagnosis. Richard M. Draffin M.D. Electronically signed: 05/13/12    Assessment    Redundant chest wall skin status post bilateral mastectomy.  Rectal bleeding secondary to internal hemorrhoid, minimally symptomatic.    Plan    The patient is likely going to have the redundant skin were resected. The procedure was reviewed. She'll look at her schedule and see when she would like to proceed.  Options for management of the internal hemorrhoid  were reviewed: 1) trial of Anusol HC suppositories versus 2) hemorrhoid banding versus 3) observation. The patient was not tickly interested in suppositories or intervention at this time. The patient's colonoscopy in 2014 showed one tubular adenoma, and today's anoscopy shows a source for the rectal bleeding, repeat colonoscopy is not indicated.     PCP:  Carolin Coy 01/25/2015, 6:38 AM

## 2015-01-24 NOTE — Patient Instructions (Addendum)
The patient is aware to call back for any questions or concerns. Follow up in one year breast cancer check up .

## 2015-01-25 ENCOUNTER — Encounter: Payer: Self-pay | Admitting: General Surgery

## 2015-01-25 DIAGNOSIS — L987 Excessive and redundant skin and subcutaneous tissue: Secondary | ICD-10-CM | POA: Insufficient documentation

## 2015-02-23 ENCOUNTER — Encounter: Payer: Self-pay | Admitting: General Surgery

## 2015-03-19 HISTORY — PX: OTHER SURGICAL HISTORY: SHX169

## 2015-03-31 ENCOUNTER — Other Ambulatory Visit: Payer: Self-pay | Admitting: Nurse Practitioner

## 2015-04-06 ENCOUNTER — Other Ambulatory Visit: Payer: Self-pay | Admitting: Nurse Practitioner

## 2015-04-06 MED ORDER — ZOLPIDEM TARTRATE 10 MG PO TABS: ORAL_TABLET | ORAL | Status: DC

## 2015-04-10 IMAGING — CR DG OUTSIDE FILMS CHEST
7 of 8 series · 7 of 8 positions shown · non-contrast
Comparison: none

[CC (1 of 7)]
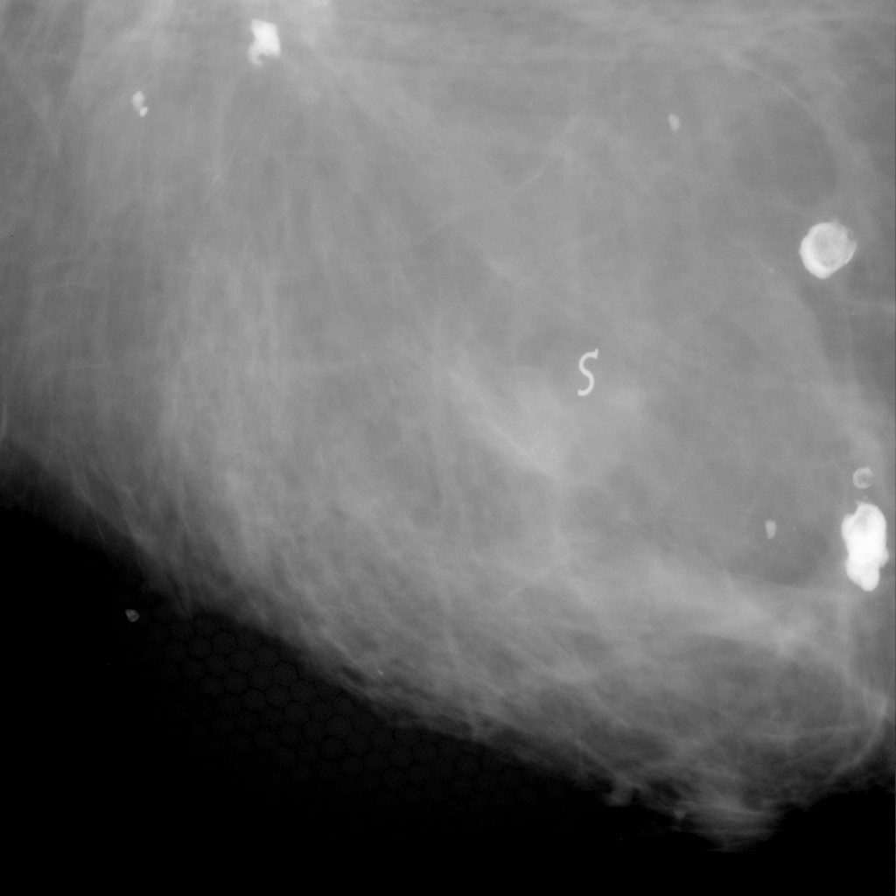

[CC (2 of 7)]
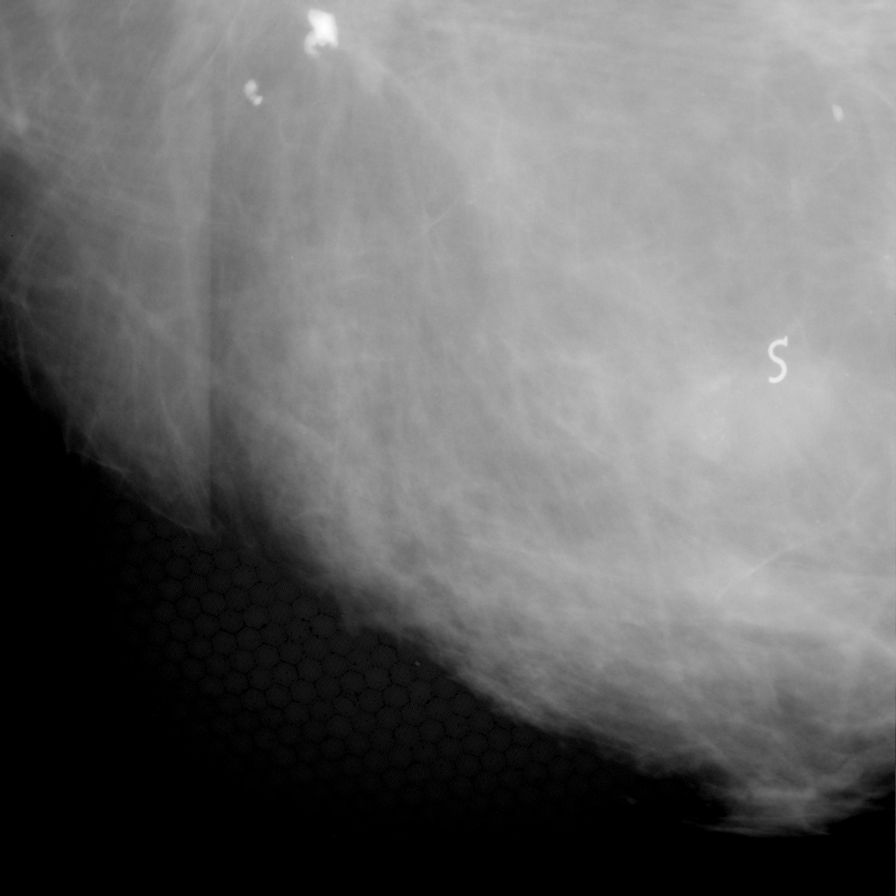

[CC (3 of 7)]
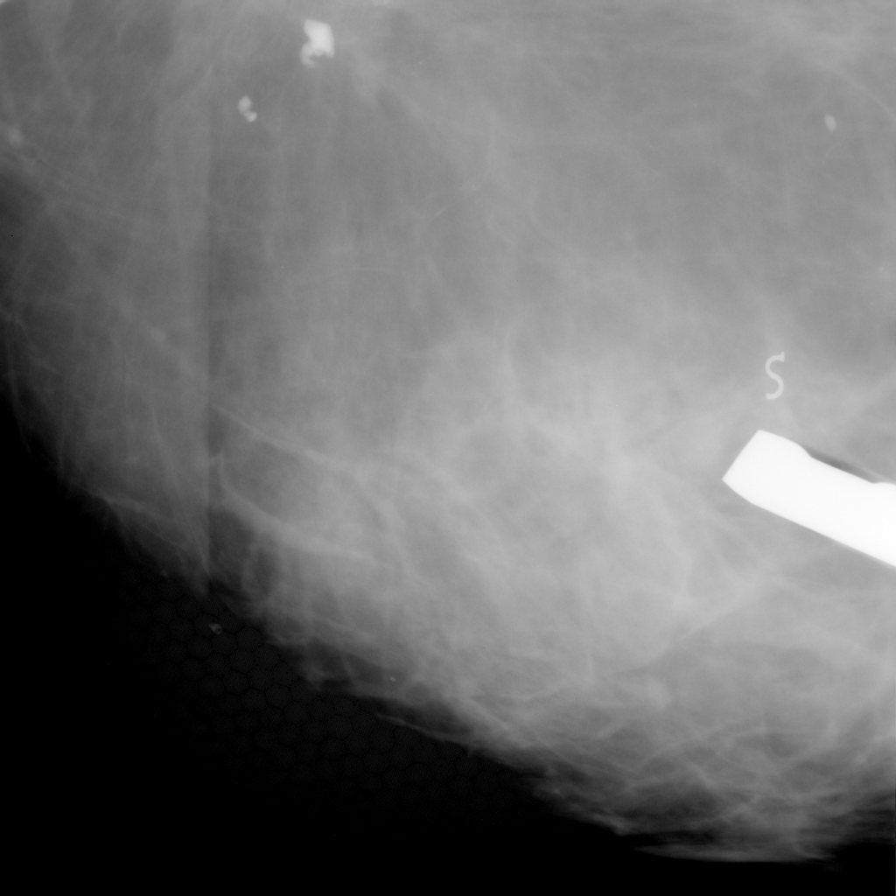

[CC (4 of 7)]
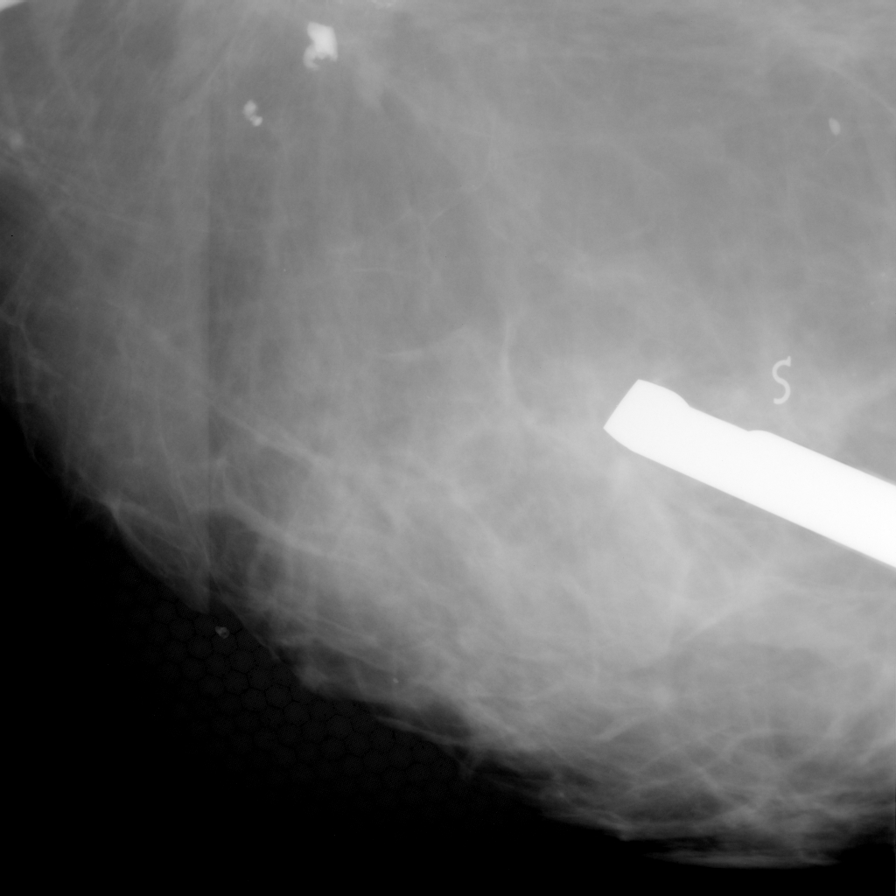

[CC (5 of 7)]
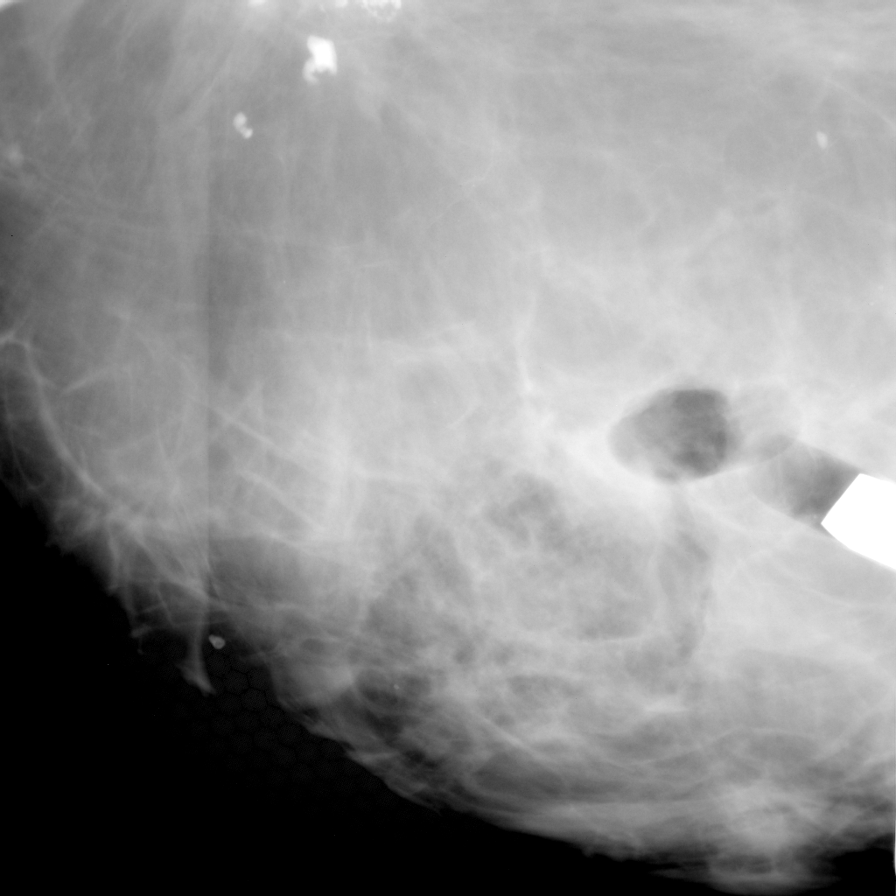

[CC (6 of 7)]
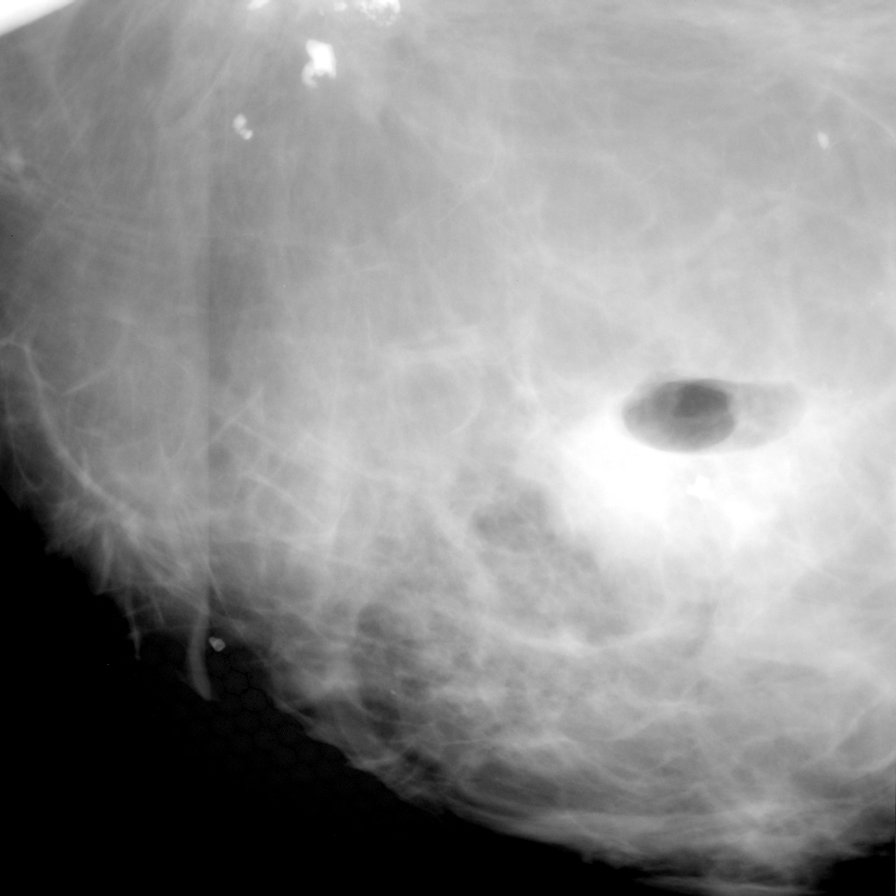

[CC (7 of 7)]
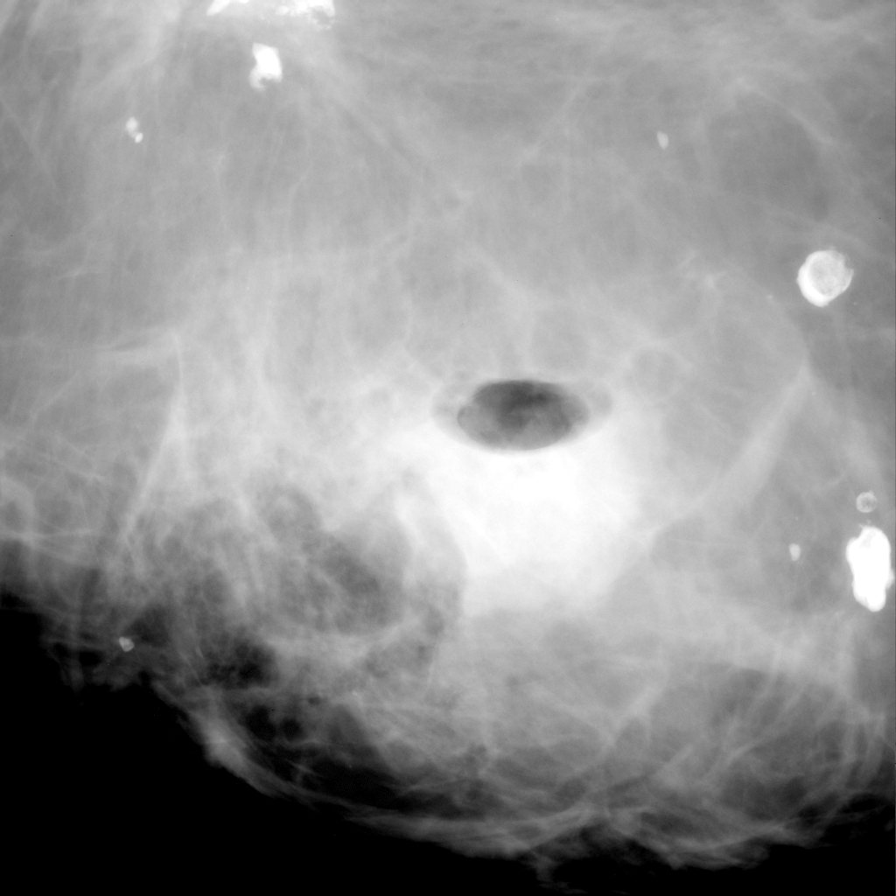

[7 of 8 positions shown; findings below may reference images not displayed]

**** An original report or order could not be provided from the [HOSPITAL] [facility] ****

## 2015-06-12 ENCOUNTER — Ambulatory Visit (INDEPENDENT_AMBULATORY_CARE_PROVIDER_SITE_OTHER): Payer: Medicare Other | Admitting: Nurse Practitioner

## 2015-06-12 ENCOUNTER — Encounter: Payer: Self-pay | Admitting: Nurse Practitioner

## 2015-06-12 VITALS — BP 142/82 | HR 103 | Temp 97.8°F | Resp 16 | Ht 62.0 in | Wt 153.6 lb

## 2015-06-12 DIAGNOSIS — K219 Gastro-esophageal reflux disease without esophagitis: Secondary | ICD-10-CM | POA: Diagnosis not present

## 2015-06-12 DIAGNOSIS — R945 Abnormal results of liver function studies: Secondary | ICD-10-CM | POA: Diagnosis not present

## 2015-06-12 DIAGNOSIS — D72819 Decreased white blood cell count, unspecified: Secondary | ICD-10-CM | POA: Diagnosis not present

## 2015-06-12 DIAGNOSIS — E785 Hyperlipidemia, unspecified: Secondary | ICD-10-CM

## 2015-06-12 DIAGNOSIS — G479 Sleep disorder, unspecified: Secondary | ICD-10-CM

## 2015-06-12 LAB — CBC WITH DIFFERENTIAL/PLATELET
BASOS ABS: 0 10*3/uL (ref 0.0–0.1)
Basophils Relative: 0.7 % (ref 0.0–3.0)
EOS ABS: 0.3 10*3/uL (ref 0.0–0.7)
Eosinophils Relative: 4.2 % (ref 0.0–5.0)
HEMATOCRIT: 36.2 % (ref 36.0–46.0)
HEMOGLOBIN: 12 g/dL (ref 12.0–15.0)
LYMPHS PCT: 44.3 % (ref 12.0–46.0)
Lymphs Abs: 2.7 10*3/uL (ref 0.7–4.0)
MCHC: 33.2 g/dL (ref 30.0–36.0)
MCV: 93.4 fl (ref 78.0–100.0)
Monocytes Absolute: 0.6 10*3/uL (ref 0.1–1.0)
Monocytes Relative: 9.4 % (ref 3.0–12.0)
Neutro Abs: 2.5 10*3/uL (ref 1.4–7.7)
Neutrophils Relative %: 41.4 % — ABNORMAL LOW (ref 43.0–77.0)
Platelets: 233 10*3/uL (ref 150.0–400.0)
RBC: 3.88 Mil/uL (ref 3.87–5.11)
RDW: 14.4 % (ref 11.5–15.5)
WBC: 6.1 10*3/uL (ref 4.0–10.5)

## 2015-06-12 LAB — COMPREHENSIVE METABOLIC PANEL
ALBUMIN: 4.5 g/dL (ref 3.5–5.2)
ALT: 26 U/L (ref 0–35)
AST: 28 U/L (ref 0–37)
Alkaline Phosphatase: 128 U/L — ABNORMAL HIGH (ref 39–117)
BILIRUBIN TOTAL: 0.5 mg/dL (ref 0.2–1.2)
BUN: 21 mg/dL (ref 6–23)
CALCIUM: 9.9 mg/dL (ref 8.4–10.5)
CO2: 30 mEq/L (ref 19–32)
CREATININE: 0.93 mg/dL (ref 0.40–1.20)
Chloride: 101 mEq/L (ref 96–112)
GFR: 63.9 mL/min (ref >60.00)
Glucose, Bld: 99 mg/dL (ref 70–99)
Potassium: 4.3 mEq/L (ref 3.5–5.1)
Sodium: 140 mEq/L (ref 135–145)
Total Protein: 7.2 g/dL (ref 6.0–8.3)

## 2015-06-12 MED ORDER — TRIAMTERENE-HCTZ 37.5-25 MG PO TABS: 0.5000 | ORAL_TABLET | Freq: Every day | ORAL | Status: DC

## 2015-06-12 MED ORDER — OMEPRAZOLE 20 MG PO CPDR: 20.0000 mg | DELAYED_RELEASE_CAPSULE | Freq: Two times a day (BID) | ORAL | Status: DC

## 2015-06-12 MED ORDER — ZOLPIDEM TARTRATE 10 MG PO TABS: ORAL_TABLET | ORAL | Status: DC

## 2015-06-12 MED ORDER — SIMVASTATIN 40 MG PO TABS: 40.0000 mg | ORAL_TABLET | Freq: Every evening | ORAL | Status: DC

## 2015-06-12 MED ORDER — VENLAFAXINE HCL 75 MG PO TABS: 75.0000 mg | ORAL_TABLET | Freq: Two times a day (BID) | ORAL | Status: DC

## 2015-06-12 NOTE — Patient Instructions (Signed)
See you in 6 months!

## 2015-06-12 NOTE — Progress Notes (Signed)
Patient ID: Erin Roberts, female    DOB: 12-09-1950  Age: 65 y.o. MRN: [id number]  CC: Medication Refill   HPI Erin Roberts presents for 6 month follow up  1) Pt needs medication refills today  90 day supply for all except Ambien requested  Ambien- takes 1/2 every few days and reports it is helpful, wishes to continue. Not at high risk for falls  Effexor- stable on this currently and without concern. Taking 75 mg twice daily   Maxide-25- stable, BP slightly up today. No changes to medications at this time.   Prilosec- still taking, finds it helpful    History Erin Roberts has a past medical history of Carpal tunnel syndrome (1990's); Hypertension (2013); Tendonitis (1990's); Fat necrosis of breast (2007); Barrett's esophagus (2008); Colon polyps (2006); Breast cancer ([place]) (1990); Malignant neoplasm of breast (female), unspecified site (Oct 2014); Chicken pox; Depression; GERD (gastroesophageal reflux disease); and Hyperlipidemia.   She has past surgical history that includes Colonoscopy (March 2014); Breast reduction surgery (2004); Abdominal hysterectomy (1997); Cholecystectomy (1970); Tonsillectomy (1967); Appendectomy (1970); Carpal tunnel release (1998, 2004); Breast surgery (Right, 1990); Breast surgery (Bilateral, 12-28-12); kidney biospy; Mastectomy (2014); and Upper gi endoscopy (February 2014).   Her family history includes Colon cancer (age of onset: 20) in her sister; Colon cancer (age of onset: 44) in her mother; Colon cancer (age of onset: 74) in her father; Diabetes in her brother and sister; Hyperlipidemia in her mother; Hypertension in her mother; Kidney disease in her mother.She reports that she has never smoked. She has never used smokeless tobacco. She reports that she drinks about 2.4 oz of alcohol per week. She reports that she does not use illicit drugs.  Outpatient Prescriptions Prior to Visit  Medication Sig Dispense Refill  . omeprazole (PRILOSEC) 20 MG  capsule TAKE ONE CAPSULE TWICE A DAY 180 capsule 1  . simvastatin (ZOCOR) 40 MG tablet TAKE ONE TABLET EVERY EVENING 90 tablet 1  . triamterene-hydrochlorothiazide (MAXZIDE-25) 37.5-25 MG per tablet TAKE ONE-HALF TABLET DAILY 45 tablet 1  . venlafaxine (EFFEXOR) 75 MG tablet TAKE ONE TABLET TWICE A DAY WITH FOOD 180 tablet 1  . zolpidem (AMBIEN) 10 MG tablet TAKE ONE-HALF TABLET AT BEDTIME 30 tablet 0   No facility-administered medications prior to visit.    ROS Review of Systems  Constitutional: Negative for fever, chills, diaphoresis and fatigue.  Respiratory: Negative for chest tightness, shortness of breath and wheezing.   Cardiovascular: Negative for chest pain, palpitations and leg swelling.  Gastrointestinal: Negative for nausea, vomiting and diarrhea.  Skin: Negative for rash.  Neurological: Negative for dizziness, weakness, numbness and headaches.  Psychiatric/Behavioral: Positive for sleep disturbance. The patient is not nervous/anxious.     Objective:  BP 142/82 mmHg  Pulse 103  Temp(Src) 97.8 F (36.6 C) (Oral)  Resp 16  Ht '5\' 2"'$  (1.575 m)  Wt 153 lb 9.6 oz (69.673 kg)  BMI 28.09 kg/m2  SpO2 97%  Physical Exam  Constitutional: She is oriented to person, place, and time. She appears well-developed and well-nourished. No distress.  HENT:  Head: Normocephalic and atraumatic.  Right Ear: External ear normal.  Left Ear: External ear normal.  Cardiovascular: Regular rhythm and normal heart sounds.  Exam reveals no gallop and no friction rub.   No murmur heard. Slightly tachy  Pulmonary/Chest: Effort normal and breath sounds normal. No respiratory distress. She has no wheezes. She has no rales. She exhibits no tenderness.  Neurological: She is alert and oriented to person,  place, and time. No cranial nerve deficit. She exhibits normal muscle tone. Coordination normal.  Skin: Skin is warm and dry. No rash noted. She is not diaphoretic.  Psychiatric: She has a normal  mood and affect. Her behavior is normal. Judgment and thought content normal.   Assessment & Plan:   Erin Roberts was seen today for medication refill.  Diagnoses and all orders for this visit:  Hyperlipidemia -     CBC with Differential/Platelet -     Comp Met (CMET)  Leukocytopenia -     CBC with Differential/Platelet -     Comp Met (CMET)  LIVER FUNCTION TESTS, ABNORMAL -     CBC with Differential/Platelet -     Comp Met (CMET)  Gastroesophageal reflux disease without esophagitis  Sleep disturbance  Other orders -     omeprazole (PRILOSEC) 20 MG capsule; Take 1 capsule (20 mg total) by mouth 2 (two) times daily. -     simvastatin (ZOCOR) 40 MG tablet; Take 1 tablet (40 mg total) by mouth every evening. -     triamterene-hydrochlorothiazide (MAXZIDE-25) 37.5-25 MG tablet; Take 0.5 tablets by mouth daily. -     venlafaxine (EFFEXOR) 75 MG tablet; Take 1 tablet (75 mg total) by mouth 2 (two) times daily with a meal. -     zolpidem (AMBIEN) 10 MG tablet; TAKE ONE-HALF TABLET AT BEDTIME   I have changed Erin Roberts's omeprazole, simvastatin, triamterene-hydrochlorothiazide, and venlafaxine. I am also having her maintain her zolpidem.  Meds ordered this encounter  Medications  . omeprazole (PRILOSEC) 20 MG capsule    Sig: Take 1 capsule (20 mg total) by mouth 2 (two) times daily.    Dispense:  180 capsule    Refill:  1    Order Specific Question:  Supervising Provider    Answer:  Deborra Medina L [[id number]]  . simvastatin (ZOCOR) 40 MG tablet    Sig: Take 1 tablet (40 mg total) by mouth every evening.    Dispense:  90 tablet    Refill:  1    Order Specific Question:  Supervising Provider    Answer:  Deborra Medina L [[id number]]  . triamterene-hydrochlorothiazide (MAXZIDE-25) 37.5-25 MG tablet    Sig: Take 0.5 tablets by mouth daily.    Dispense:  45 tablet    Refill:  1    Order Specific Question:  Supervising Provider    Answer:  Deborra Medina L [[id number]]  . venlafaxine (EFFEXOR) 75  MG tablet    Sig: Take 1 tablet (75 mg total) by mouth 2 (two) times daily with a meal.    Dispense:  180 tablet    Refill:  1    Order Specific Question:  Supervising Provider    Answer:  Deborra Medina L [[id number]]  . zolpidem (AMBIEN) 10 MG tablet    Sig: TAKE ONE-HALF TABLET AT BEDTIME    Dispense:  30 tablet    Refill:  2    Order Specific Question:  Supervising Provider    Answer:  Crecencio Mc [[id number]]     Follow-up: Return in about 6 months (around 12/13/2015) for Medication follow up.

## 2015-06-13 DIAGNOSIS — G4700 Insomnia, unspecified: Secondary | ICD-10-CM | POA: Insufficient documentation

## 2015-06-13 NOTE — Assessment & Plan Note (Signed)
Will check at next visit  Zocor filled and denies myalgias

## 2015-06-13 NOTE — Assessment & Plan Note (Signed)
Checking CBC w/ diff today No concerns per pt

## 2015-06-13 NOTE — Assessment & Plan Note (Signed)
Lab Results  Component Value Date   ALT 26 06/12/2015   AST 28 06/12/2015   ALKPHOS 128* 06/12/2015   BILITOT 0.5 06/12/2015

## 2015-06-13 NOTE — Assessment & Plan Note (Signed)
Stable currently  Prilosec filled for 6 mos  Advised healthy diet and small portions

## 2015-06-13 NOTE — Assessment & Plan Note (Signed)
Pt has difficulty with sleep occasionally Ambien 1/2 tablet doing well- faxed to pharmacy for 1 + 3 refills Low risk for falls, only factor is age

## 2015-07-25 ENCOUNTER — Encounter: Payer: Self-pay | Admitting: Nurse Practitioner

## 2015-07-27 ENCOUNTER — Encounter: Payer: Self-pay | Admitting: Internal Medicine

## 2015-11-02 ENCOUNTER — Other Ambulatory Visit: Payer: Self-pay | Admitting: Nurse Practitioner

## 2015-11-02 MED ORDER — ZOLPIDEM TARTRATE 10 MG PO TABS: ORAL_TABLET | ORAL | 2 refills | Status: DC

## 2015-11-02 NOTE — Telephone Encounter (Signed)
refilled  05/2015. Former patient of Carrie's will has a scheduled appointment with you on 12/13/15. Please advise/

## 2015-11-02 NOTE — Telephone Encounter (Signed)
faxed

## 2015-12-08 ENCOUNTER — Ambulatory Visit (INDEPENDENT_AMBULATORY_CARE_PROVIDER_SITE_OTHER): Payer: Medicare Other | Admitting: Family Medicine

## 2015-12-08 ENCOUNTER — Encounter: Payer: Self-pay | Admitting: Family Medicine

## 2015-12-08 DIAGNOSIS — R739 Hyperglycemia, unspecified: Secondary | ICD-10-CM

## 2015-12-08 DIAGNOSIS — E785 Hyperlipidemia, unspecified: Secondary | ICD-10-CM | POA: Diagnosis not present

## 2015-12-08 DIAGNOSIS — I10 Essential (primary) hypertension: Secondary | ICD-10-CM | POA: Diagnosis not present

## 2015-12-08 DIAGNOSIS — K22719 Barrett's esophagus with dysplasia, unspecified: Secondary | ICD-10-CM | POA: Diagnosis not present

## 2015-12-08 DIAGNOSIS — G4700 Insomnia, unspecified: Secondary | ICD-10-CM

## 2015-12-08 LAB — LIPID PANEL
Cholesterol: 195 mg/dL (ref 0–200)
HDL: 70.9 mg/dL (ref >39.00)
NONHDL: 123.62
Total CHOL/HDL Ratio: 3
Triglycerides: 273 mg/dL — ABNORMAL HIGH (ref 0.0–149.0)
VLDL: 54.6 mg/dL — ABNORMAL HIGH (ref 0.0–40.0)

## 2015-12-08 LAB — LDL CHOLESTEROL, DIRECT: LDL DIRECT: 105 mg/dL

## 2015-12-08 LAB — HEMOGLOBIN A1C: HEMOGLOBIN A1C: 5.9 % (ref 4.6–6.5)

## 2015-12-08 MED ORDER — SIMVASTATIN 40 MG PO TABS: 40.0000 mg | ORAL_TABLET | Freq: Every evening | ORAL | 3 refills | Status: DC

## 2015-12-08 MED ORDER — ZOLPIDEM TARTRATE 10 MG PO TABS: ORAL_TABLET | ORAL | 0 refills | Status: DC

## 2015-12-08 MED ORDER — TRIAMTERENE-HCTZ 37.5-25 MG PO TABS: 0.5000 | ORAL_TABLET | Freq: Every day | ORAL | 3 refills | Status: AC

## 2015-12-08 MED ORDER — OMEPRAZOLE 20 MG PO CPDR: 20.0000 mg | DELAYED_RELEASE_CAPSULE | Freq: Two times a day (BID) | ORAL | 3 refills | Status: AC

## 2015-12-08 MED ORDER — VENLAFAXINE HCL 75 MG PO TABS: 75.0000 mg | ORAL_TABLET | Freq: Two times a day (BID) | ORAL | 3 refills | Status: AC

## 2015-12-08 NOTE — Assessment & Plan Note (Signed)
Advised to follow up with GI or Dr. Bary Castilla.

## 2015-12-08 NOTE — Assessment & Plan Note (Signed)
Stable.  Continue triamterene/HCTZ. 

## 2015-12-08 NOTE — Assessment & Plan Note (Signed)
Stable.  Ambien refilled. 

## 2015-12-08 NOTE — Patient Instructions (Signed)
Follow up in 6 months to 1 year.  Continue your medications.  Health Maintenance, Female Adopting a healthy lifestyle and getting preventive care can go a long way to promote health and wellness. Talk with your health care provider about what schedule of regular examinations is right for you. This is a good chance for you to check in with your provider about disease prevention and staying healthy. In between checkups, there are plenty of things you can do on your own. Experts have done a lot of research about which lifestyle changes and preventive measures are most likely to keep you healthy. Ask your health care provider for more information. WEIGHT AND DIET  Eat a healthy diet  Be sure to include plenty of vegetables, fruits, low-fat dairy products, and lean protein.  Do not eat a lot of foods high in solid fats, added sugars, or salt.  Get regular exercise. This is one of the most important things you can do for your health.  Most adults should exercise for at least 150 minutes each week. The exercise should increase your heart rate and make you sweat (moderate-intensity exercise).  Most adults should also do strengthening exercises at least twice a week. This is in addition to the moderate-intensity exercise.  Maintain a healthy weight  Body mass index (BMI) is a measurement that can be used to identify possible weight problems. It estimates body fat based on height and weight. Your health care provider can help determine your BMI and help you achieve or maintain a healthy weight.  For females 42 years of age and older:   A BMI below 18.5 is considered underweight.  A BMI of 18.5 to 24.9 is normal.  A BMI of 25 to 29.9 is considered overweight.  A BMI of 30 and above is considered obese.  Watch levels of cholesterol and blood lipids  You should start having your blood tested for lipids and cholesterol at 67 years of age, then have this test every 5 years.  You may need to  have your cholesterol levels checked more often if:  Your lipid or cholesterol levels are high.  You are older than 67 years of age.  You are at high risk for heart disease.  CANCER SCREENING   Lung Cancer  Lung cancer screening is recommended for adults 55-52 years old who are at high risk for lung cancer because of a history of smoking.  A yearly low-dose CT scan of the lungs is recommended for people who:  Currently smoke.  Have quit within the past 15 years.  Have at least a 30-pack-year history of smoking. A pack year is smoking an average of one pack of cigarettes a day for 1 year.  Yearly screening should continue until it has been 15 years since you quit.  Yearly screening should stop if you develop a health problem that would prevent you from having lung cancer treatment.  Breast Cancer  Practice breast self-awareness. This means understanding how your breasts normally appear and feel.  It also means doing regular breast self-exams. Let your health care provider know about any changes, no matter how small.  If you are in your 20s or 30s, you should have a clinical breast exam (CBE) by a health care provider every 1-3 years as part of a regular health exam.  If you are 23 or older, have a CBE every year. Also consider having a breast X-ray (mammogram) every year.  If you have a family history of breast  cancer, talk to your health care provider about genetic screening.  If you are at high risk for breast cancer, talk to your health care provider about having an MRI and a mammogram every year.  Breast cancer gene (BRCA) assessment is recommended for women who have family members with BRCA-related cancers. BRCA-related cancers include:  Breast.  Ovarian.  Tubal.  Peritoneal cancers.  Results of the assessment will determine the need for genetic counseling and BRCA1 and BRCA2 testing. Cervical Cancer Your health care provider may recommend that you be screened  regularly for cancer of the pelvic organs (ovaries, uterus, and vagina). This screening involves a pelvic examination, including checking for microscopic changes to the surface of your cervix (Pap test). You may be encouraged to have this screening done every 3 years, beginning at age 62.  For women ages 67-65, health care providers may recommend pelvic exams and Pap testing every 3 years, or they may recommend the Pap and pelvic exam, combined with testing for human papilloma virus (HPV), every 5 years. Some types of HPV increase your risk of cervical cancer. Testing for HPV may also be done on women of any age with unclear Pap test results.  Other health care providers may not recommend any screening for nonpregnant women who are considered low risk for pelvic cancer and who do not have symptoms. Ask your health care provider if a screening pelvic exam is right for you.  If you have had past treatment for cervical cancer or a condition that could lead to cancer, you need Pap tests and screening for cancer for at least 20 years after your treatment. If Pap tests have been discontinued, your risk factors (such as having a new sexual partner) need to be reassessed to determine if screening should resume. Some women have medical problems that increase the chance of getting cervical cancer. In these cases, your health care provider may recommend more frequent screening and Pap tests. Colorectal Cancer  This type of cancer can be detected and often prevented.  Routine colorectal cancer screening usually begins at 67 years of age and continues through 67 years of age.  Your health care provider may recommend screening at an earlier age if you have risk factors for colon cancer.  Your health care provider may also recommend using home test kits to check for hidden blood in the stool.  A small camera at the end of a tube can be used to examine your colon directly (sigmoidoscopy or colonoscopy). This is  done to check for the earliest forms of colorectal cancer.  Routine screening usually begins at age 53.  Direct examination of the colon should be repeated every 5-10 years through 67 years of age. However, you may need to be screened more often if early forms of precancerous polyps or small growths are found. Skin Cancer  Check your skin from head to toe regularly.  Tell your health care provider about any new moles or changes in moles, especially if there is a change in a mole's shape or color.  Also tell your health care provider if you have a mole that is larger than the size of a pencil eraser.  Always use sunscreen. Apply sunscreen liberally and repeatedly throughout the day.  Protect yourself by wearing long sleeves, pants, a wide-brimmed hat, and sunglasses whenever you are outside. HEART DISEASE, DIABETES, AND HIGH BLOOD PRESSURE   High blood pressure causes heart disease and increases the risk of stroke. High blood pressure is more likely  to develop in:  People who have blood pressure in the high end of the normal range (130-139/85-89 mm Hg).  People who are overweight or obese.  People who are African American.  If you are 61-74 years of age, have your blood pressure checked every 3-5 years. If you are 31 years of age or older, have your blood pressure checked every year. You should have your blood pressure measured twice--once when you are at a hospital or clinic, and once when you are not at a hospital or clinic. Record the average of the two measurements. To check your blood pressure when you are not at a hospital or clinic, you can use:  An automated blood pressure machine at a pharmacy.  A home blood pressure monitor.  If you are between 57 years and 38 years old, ask your health care provider if you should take aspirin to prevent strokes.  Have regular diabetes screenings. This involves taking a blood sample to check your fasting blood sugar level.  If you are at  a normal weight and have a low risk for diabetes, have this test once every three years after 67 years of age.  If you are overweight and have a high risk for diabetes, consider being tested at a younger age or more often. PREVENTING INFECTION  Hepatitis B  If you have a higher risk for hepatitis B, you should be screened for this virus. You are considered at high risk for hepatitis B if:  You were born in a country where hepatitis B is common. Ask your health care provider which countries are considered high risk.  Your parents were born in a high-risk country, and you have not been immunized against hepatitis B (hepatitis B vaccine).  You have HIV or AIDS.  You use needles to inject street drugs.  You live with someone who has hepatitis B.  You have had sex with someone who has hepatitis B.  You get hemodialysis treatment.  You take certain medicines for conditions, including cancer, organ transplantation, and autoimmune conditions. Hepatitis C  Blood testing is recommended for:  Everyone born from [birth year] through 1967.  Anyone with known risk factors for hepatitis C. Sexually transmitted infections (STIs)  You should be screened for sexually transmitted infections (STIs) including gonorrhea and chlamydia if:  You are sexually active and are younger than 67 years of age.  You are older than 67 years of age and your health care provider tells you that you are at risk for this type of infection.  Your sexual activity has changed since you were last screened and you are at an increased risk for chlamydia or gonorrhea. Ask your health care provider if you are at risk.  If you do not have HIV, but are at risk, it may be recommended that you take a prescription medicine daily to prevent HIV infection. This is called pre-exposure prophylaxis (PrEP). You are considered at risk if:  You are sexually active and do not regularly use condoms or know the HIV status of your  partner(s).  You take drugs by injection.  You are sexually active with a partner who has HIV. Talk with your health care provider about whether you are at high risk of being infected with HIV. If you choose to begin PrEP, you should first be tested for HIV. You should then be tested every 3 months for as long as you are taking PrEP.  PREGNANCY   If you are premenopausal and you may become  pregnant, ask your health care provider about preconception counseling.  If you may become pregnant, take 400 to 800 micrograms (mcg) of folic acid every day.  If you want to prevent pregnancy, talk to your health care provider about birth control (contraception). OSTEOPOROSIS AND MENOPAUSE   Osteoporosis is a disease in which the bones lose minerals and strength with aging. This can result in serious bone fractures. Your risk for osteoporosis can be identified using a bone density scan.  If you are 27 years of age or older, or if you are at risk for osteoporosis and fractures, ask your health care provider if you should be screened.  Ask your health care provider whether you should take a calcium or vitamin D supplement to lower your risk for osteoporosis.  Menopause may have certain physical symptoms and risks.  Hormone replacement therapy may reduce some of these symptoms and risks. Talk to your health care provider about whether hormone replacement therapy is right for you.  HOME CARE INSTRUCTIONS   Schedule regular health, dental, and eye exams.  Stay current with your immunizations.   Do not use any tobacco products including cigarettes, chewing tobacco, or electronic cigarettes.  If you are pregnant, do not drink alcohol.  If you are breastfeeding, limit how much and how often you drink alcohol.  Limit alcohol intake to no more than 1 drink per day for nonpregnant women. One drink equals 12 ounces of beer, 5 ounces of wine, or 1 ounces of hard liquor.  Do not use street drugs.  Do  not share needles.  Ask your health care provider for help if you need support or information about quitting drugs.  Tell your health care provider if you often feel depressed.  Tell your health care provider if you have ever been abused or do not feel safe at home.   This information is not intended to replace advice given to you by your health care provider. Make sure you discuss any questions you have with your health care provider.   Document Released: 09/17/2010 Document Revised: 03/25/2014 Document Reviewed: 02/03/2013 Elsevier Interactive Patient Education [employer].

## 2015-12-08 NOTE — Progress Notes (Signed)
Subjective:  Patient ID: Erin Roberts, female    DOB: 1950-06-16  Age: 65 y.o. MRN: [id number]  CC: Follow up  HPI:  65 year old female with Anxiety, HTN, HLD, Insomnia, Hx of Barrett's esophagus presents for follow up.  HTN  Stable on Triamterene/HCTZ.  HLD  No lipid Panel in the chart. Unsure of control.  On Simvastatin 40 mg.  Insomnia  Stable on Ambien. Needs refill.  Barrett's esophagus/history of colon polyps  Per EMR/Notes from GI (reviewed today), she is need of colonoscopy and EGD this year (she is overdue).  Social Hx   Social History   Social History  . Marital status: Married    Spouse name: N/A  . Number of children: N/A  . Years of education: N/A   Social History Main Topics  . Smoking status: Never Smoker  . Smokeless tobacco: Never Used  . Alcohol use 2.4 oz/week    4 Glasses of wine per week  . Drug use: No  . Sexual activity: No   Other Topics Concern  . Not on file   Social History Narrative   Newly retired    Lives with husband    Children 1    Pets: None   Caffeine- Tea 2 cups, 1/2 cup of coffee    Review of Systems  Constitutional: Negative.   Respiratory: Negative.   Cardiovascular: Negative.   Psychiatric/Behavioral: Positive for sleep disturbance.   Objective:  BP 138/82 (BP Location: Left Arm, Patient Position: Sitting, Cuff Size: Normal)   Pulse (!) 116   Temp 98 F (36.7 C) (Oral)   Wt 156 lb 6 oz (70.9 kg)   SpO2 97%   BMI 28.60 kg/m   BP/Weight 12/08/2015 06/12/2015 0000000  Systolic BP 0000000 [id number]  Diastolic BP 82 82 68  Wt. (Lbs) 156.38 153.6 150  BMI 28.6 28.09 27.43   Physical Exam  Constitutional: She is oriented to person, place, and time. She appears well-developed. No distress.  Cardiovascular: Normal rate and regular rhythm.   Pulmonary/Chest: Effort normal. She has no wheezes. She has no rales.  Abdominal: Soft. She exhibits no distension. There is no tenderness. There is no rebound and no  guarding.  Neurological: She is alert and oriented to person, place, and time.  Psychiatric: She has a normal mood and affect.  Vitals reviewed.  Lab Results  Component Value Date   WBC 6.1 06/12/2015   HGB 12.0 06/12/2015   HCT 36.2 06/12/2015   PLT 233.0 06/12/2015   GLUCOSE 99 06/12/2015   ALT 26 06/12/2015   AST 28 06/12/2015   NA 140 06/12/2015   K 4.3 06/12/2015   CL 101 06/12/2015   CREATININE 0.93 06/12/2015   BUN 21 06/12/2015   CO2 30 06/12/2015    Assessment & Plan:   Problem List Items Addressed This Visit    Barrett's esophagus    Advised to follow up with GI or Dr. Bary Castilla.      Benign essential HTN    Stable. Continue triamterene HCTZ.      Relevant Medications   simvastatin (ZOCOR) 40 MG tablet   triamterene-hydrochlorothiazide (MAXZIDE-25) 37.5-25 MG tablet   Hyperlipidemia    Unsure of control. Lipid panel today. Continuing simvastatin.      Relevant Medications   simvastatin (ZOCOR) 40 MG tablet   triamterene-hydrochlorothiazide (MAXZIDE-25) 37.5-25 MG tablet   Other Relevant Orders   Lipid Profile   Insomnia    Stable. Ambien refilled.  Other Visit Diagnoses    Blood glucose elevated       Relevant Orders   HgB A1c      Meds ordered this encounter  Medications  . zolpidem (AMBIEN) 10 MG tablet    Sig: TAKE ONE-HALF TABLET AT BEDTIME    Dispense:  90 tablet    Refill:  0  . omeprazole (PRILOSEC) 20 MG capsule    Sig: Take 1 capsule (20 mg total) by mouth 2 (two) times daily.    Dispense:  180 capsule    Refill:  3  . simvastatin (ZOCOR) 40 MG tablet    Sig: Take 1 tablet (40 mg total) by mouth every evening.    Dispense:  90 tablet    Refill:  3  . triamterene-hydrochlorothiazide (MAXZIDE-25) 37.5-25 MG tablet    Sig: Take 0.5 tablets by mouth daily.    Dispense:  45 tablet    Refill:  3  . venlafaxine (EFFEXOR) 75 MG tablet    Sig: Take 1 tablet (75 mg total) by mouth 2 (two) times daily with a meal.     Dispense:  180 tablet    Refill:  3    Follow-up: Return for 6 months to 1 year.  [place]o

## 2015-12-08 NOTE — Progress Notes (Signed)
Pre visit review using our clinic review tool, if applicable. No additional management support is needed unless otherwise documented below in the visit note. 

## 2015-12-08 NOTE — Assessment & Plan Note (Signed)
Unsure of control. Lipid panel today. Continuing simvastatin.

## 2015-12-12 ENCOUNTER — Telehealth: Payer: Self-pay | Admitting: Nurse Practitioner

## 2015-12-12 NOTE — Telephone Encounter (Signed)
Pt called and given results 

## 2015-12-12 NOTE — Telephone Encounter (Signed)
Pt called returning your call. Thank you! °

## 2015-12-13 ENCOUNTER — Encounter: Payer: Self-pay | Admitting: *Deleted

## 2015-12-13 ENCOUNTER — Ambulatory Visit: Payer: Medicare Other | Admitting: Nurse Practitioner

## 2015-12-13 ENCOUNTER — Ambulatory Visit: Payer: Medicare Other | Admitting: Family Medicine

## 2015-12-18 ENCOUNTER — Other Ambulatory Visit: Payer: Self-pay | Admitting: Family Medicine

## 2015-12-18 MED ORDER — SIMVASTATIN 80 MG PO TABS: 80.0000 mg | ORAL_TABLET | Freq: Every day | ORAL | 3 refills | Status: AC

## 2016-01-25 ENCOUNTER — Ambulatory Visit: Payer: Self-pay | Admitting: General Surgery

## 2016-01-29 ENCOUNTER — Other Ambulatory Visit: Payer: Self-pay | Admitting: Family Medicine

## 2016-01-30 MED ORDER — ZOLPIDEM TARTRATE 10 MG PO TABS: ORAL_TABLET | ORAL | 0 refills | Status: DC

## 2016-01-30 NOTE — Telephone Encounter (Signed)
Last refill sent in 12/08/15. Last OV 12/08/15.

## 2016-01-30 NOTE — Telephone Encounter (Signed)
faxed

## 2016-01-31 ENCOUNTER — Encounter: Payer: Self-pay | Admitting: General Surgery

## 2016-01-31 ENCOUNTER — Ambulatory Visit (INDEPENDENT_AMBULATORY_CARE_PROVIDER_SITE_OTHER): Payer: Medicare Other | Admitting: General Surgery

## 2016-01-31 VITALS — BP 144/76 | HR 102 | Resp 12 | Ht 62.0 in | Wt 153.0 lb

## 2016-01-31 DIAGNOSIS — Z853 Personal history of malignant neoplasm of breast: Secondary | ICD-10-CM

## 2016-01-31 NOTE — Progress Notes (Signed)
Patient ID: Erin Roberts, female   DOB: 08/03/1950, 65 y.o.   MRN: [id number]  Chief Complaint  Patient presents with  . Breast Cancer Long Term Follow Up    HPI Erin Roberts is a 65 y.o. female.  Here today for her follow up breast cancer exam. She states she is doing well. No new issues. No further rectal bleeding.  HPI  Past Medical History:  Diagnosis Date  . Barrett's esophagus 2008  . Breast cancer ([place]) 1990   Infiltrating ductal carcinoma  . Carpal tunnel syndrome 1990's  . Chicken pox   . Colon polyps 2006  . Depression   . Fat necrosis of breast 2007   Left  . GERD (gastroesophageal reflux disease)   . Hyperlipidemia   . Hypertension 2013  . Malignant neoplasm of breast (female), unspecified site Oct 2014   , 4 mm. T1a,Nx; ER 90%, PR 40%, HER-2/neu not amplified. No indication for antiestrogen therapy after [facility] tumor board Presentation.    Past Surgical History:  Procedure Laterality Date  . ABDOMINAL HYSTERECTOMY  1997  . APPENDECTOMY  1970  . BREAST REDUCTION SURGERY  2004  . BREAST SURGERY Right 1990   infiltrating ductal carcinoma  . BREAST SURGERY Bilateral 12-28-12   mastectomy  . [place], 2004  . cataracts Bilateral 2017   [facility]  . CHOLECYSTECTOMY  1970  . COLONOSCOPY  March 2014   4 rectal polyps, one tubular adenoma. Followup exam due in 2019.  . kidney biospy    . MASTECTOMY  2014  . TONSILLECTOMY  1967  . UPPER GI ENDOSCOPY  February 2014   No Barrett's epithelium reported.    Family History  Problem Relation Age of Onset  . Colon cancer Father 7  . Colon cancer Mother 41  . Hyperlipidemia Mother   . Hypertension Mother   . Kidney disease Mother   . Colon cancer Sister 20  . Diabetes Sister   . Diabetes Brother     Social History Social History  Substance Use Topics  . Smoking status: Never Smoker  . Smokeless tobacco: Never Used  . Alcohol use 2.4 oz/week    4 Glasses of wine per week     Allergies  Allergen Reactions  . Codeine     REACTION: nausea/vomiting    Current Outpatient Prescriptions  Medication Sig Dispense Refill  . omeprazole (PRILOSEC) 20 MG capsule Take 1 capsule (20 mg total) by mouth 2 (two) times daily. 180 capsule 3  . simvastatin (ZOCOR) 80 MG tablet Take 1 tablet (80 mg total) by mouth daily. 90 tablet 3  . triamterene-hydrochlorothiazide (MAXZIDE-25) 37.5-25 MG tablet Take 0.5 tablets by mouth daily. 45 tablet 3  . venlafaxine (EFFEXOR) 75 MG tablet Take 1 tablet (75 mg total) by mouth 2 (two) times daily with a meal. 180 tablet 3  . zolpidem (AMBIEN) 10 MG tablet TAKE ONE-HALF TABLET AT BEDTIME 90 tablet 0   No current facility-administered medications for this visit.     Review of Systems Review of Systems  Constitutional: Negative.   Respiratory: Negative.   Cardiovascular: Negative.     Blood pressure (!) 144/76, pulse (!) 102, resp. rate 12, height '5\' 2"'$  (1.575 m), weight 153 lb (69.4 kg), SpO2 98 %.  Physical Exam Physical Exam  Constitutional: She is oriented to person, place, and time. She appears well-developed and well-nourished.  HENT:  Mouth/Throat: Oropharynx is clear and moist.  Eyes: Conjunctivae are normal. No scleral icterus.  Neck: Neck supple.  Cardiovascular: Normal rate, regular rhythm and normal heart sounds.   Pulmonary/Chest: Effort normal and breath sounds normal.    Redundant chest wall skin status post bilateral mastectomy.   Lymphadenopathy:    She has no cervical adenopathy.    She has no axillary adenopathy.  Neurological: She is alert and oriented to person, place, and time.  Skin: Skin is warm and dry.  Psychiatric: Her behavior is normal.      Assessment    Doing well status post bilateral mastectomy without evidence of local/regional recurrence.    Plan         Follow up in one year. Colonoscopy due 2019. The patient is aware to call back for any questions or concerns.   This  information has been scribed by Karie Fetch RN, BSN,BC.   Robert Bellow 01/31/2016, 11:38 PM

## 2016-01-31 NOTE — Patient Instructions (Signed)
The patient is aware to call back for any questions or concerns.  

## 2016-04-10 ENCOUNTER — Encounter: Payer: Self-pay | Admitting: Family Medicine

## 2016-04-12 ENCOUNTER — Ambulatory Visit (INDEPENDENT_AMBULATORY_CARE_PROVIDER_SITE_OTHER): Payer: Medicare Other | Admitting: Family Medicine

## 2016-04-12 ENCOUNTER — Encounter: Payer: Self-pay | Admitting: Family Medicine

## 2016-04-12 DIAGNOSIS — M25571 Pain in right ankle and joints of right foot: Secondary | ICD-10-CM | POA: Diagnosis not present

## 2016-04-12 NOTE — Patient Instructions (Signed)
Rest, Ice, Compression, Elevation.  Call if it does not completely resolve.  Take care  Dr. Lacinda Axon

## 2016-04-13 DIAGNOSIS — M25571 Pain in right ankle and joints of right foot: Secondary | ICD-10-CM | POA: Insufficient documentation

## 2016-04-13 NOTE — Progress Notes (Signed)
   Subjective:  Patient ID: Erin Roberts, female    DOB: 09/21/1950  Age: 66 y.o. MRN: [id number]  CC: R ankle pain  HPI:  66 year old female presents with the above complaint.  R ankle pain  Started Saturday.  No known injury, fall, trauma.  Pain was severe initially. Has been improving.  No associated swelling or redness.  No medications or interventions tried.  She states that she injured this ankle/foot previously and is concerned that that is the culprit.  Social Hx   Social History   Social History  . Marital status: Married    Spouse name: N/A  . Number of children: N/A  . Years of education: N/A   Social History Main Topics  . Smoking status: Never Smoker  . Smokeless tobacco: Never Used  . Alcohol use 2.4 oz/week    4 Glasses of wine per week  . Drug use: No  . Sexual activity: No   Other Topics Concern  . None   Social History Narrative   Newly retired    Lives with husband    Children 1    Pets: None   Caffeine- Tea 2 cups, 1/2 cup of coffee     Review of Systems  Constitutional: Negative.   Musculoskeletal:       R ankle pain.   Objective:  BP 133/80   Pulse (!) 115   Temp 97.6 F (36.4 C) (Oral)   Wt 152 lb (68.9 kg)   SpO2 93%   BMI 27.80 kg/m   BP/Weight 04/12/2016 01/31/2016 [id number]  Systolic BP [id number]  Diastolic BP 80 76 82  Wt. (Lbs) 152 153 156.38  BMI 27.8 27.98 28.6    Physical Exam  Constitutional: She is oriented to person, place, and time. She appears well-developed. No distress.  Pulmonary/Chest: Effort normal.  Musculoskeletal:  R ankle - Normal ROM. Normal strength. No laxity. No swelling. Minimal tenderness at the lateral malleolus.  Neurological: She is alert and oriented to person, place, and time.  Psychiatric: She has a normal mood and affect.  Vitals reviewed.   Lab Results  Component Value Date   WBC 6.1 06/12/2015   HGB 12.0 06/12/2015   HCT 36.2 06/12/2015   PLT 233.0 06/12/2015   GLUCOSE 99 06/12/2015   CHOL 195 12/08/2015   TRIG 273.0 (H) 12/08/2015   HDL 70.90 12/08/2015   LDLDIRECT 105.0 12/08/2015   ALT 26 06/12/2015   AST 28 06/12/2015   NA 140 06/12/2015   K 4.3 06/12/2015   CL 101 06/12/2015   CREATININE 0.93 06/12/2015   BUN 21 06/12/2015   CO2 30 06/12/2015   HGBA1C 5.9 12/08/2015    Assessment & Plan:   Problem List Items Addressed This Visit    Right ankle pain    New problem. Minimal tenderness on exam. Exam essentially unremarkable. No indication for xray. Advised RICE. PRN Tylenol or Ibuprofen/Aleve.        Follow-up: PRN  [place]

## 2016-04-13 NOTE — Assessment & Plan Note (Signed)
New problem. Minimal tenderness on exam. Exam essentially unremarkable. No indication for xray. Advised RICE. PRN Tylenol or Ibuprofen/Aleve.

## 2016-07-05 ENCOUNTER — Ambulatory Visit (INDEPENDENT_AMBULATORY_CARE_PROVIDER_SITE_OTHER): Payer: Medicare Other

## 2016-07-05 VITALS — BP 122/62 | HR 104 | Temp 98.5°F | Resp 14 | Ht 62.0 in | Wt 151.2 lb

## 2016-07-05 DIAGNOSIS — Z0000 Encounter for general adult medical examination without abnormal findings: Secondary | ICD-10-CM | POA: Diagnosis not present

## 2016-07-05 DIAGNOSIS — R5383 Other fatigue: Secondary | ICD-10-CM

## 2016-07-05 NOTE — Patient Instructions (Addendum)
  Erin Roberts , Thank you for taking time to come for your Medicare Wellness Visit. I appreciate your ongoing commitment to your health goals. Please review the following plan we discussed and let me know if I can assist you in the future.   Follow up with Dr. Lacinda Axon as needed.    Bring a copy of your [place] and/or Living Will to be scanned into chart.  Have a great day!  These are the goals we discussed: Goals    . Increase lean proteins          Low carb foods. Educational material provided.    . Increase physical activity          Stay active and walk/aerobic class for exercise    . Increase water intake          Stay hydrated       This is a list of the screening recommended for you and due dates:  Health Maintenance  Topic Date Due  .  Hepatitis C: One time screening is recommended by Center for Disease Control  (CDC) for  adults born from [birth year] through 1967.   March 16, 1951  . Flu Shot  12/07/2016*  . Tetanus Vaccine  07/15/2017*  . Pneumonia vaccines (1 of 2 - PCV13) 07/15/2017*  . Colon Cancer Screening  06/10/2022  . DEXA scan (bone density measurement)  Completed  *Topic was postponed. The date shown is not the original due date.

## 2016-07-05 NOTE — Progress Notes (Signed)
Care was provided under my supervision. I agree with the management as indicated in the note.  Shawna Wearing DO  

## 2016-07-05 NOTE — Progress Notes (Signed)
Subjective:   Erin Roberts is a 66 y.o. female who presents for an Initial Medicare Annual Wellness Visit.  Review of Systems    No ROS.  Medicare Wellness Visit.  Cardiac Risk Factors include: advanced age (>80mn, >>51women)     Objective:    Today's Vitals   07/05/16 1509  BP: 122/62  Pulse: (!) 104  Resp: 14  Temp: 98.5 F (36.9 C)  TempSrc: Oral  SpO2: 95%  Weight: 151 lb 3.2 oz (68.6 kg)  Height: _0  (1.575 m)   Body mass index is 27.65 kg/m.   Current Medications (verified) Outpatient Encounter Prescriptions as of 07/05/2016  Medication Sig  . clobetasol ointment (TEMOVATE) 09.79% Apply 1 application topically 2 (two) times daily.  .Marland Kitchenomeprazole (PRILOSEC) 20 MG capsule Take 1 capsule (20 mg total) by mouth 2 (two) times daily.  . simvastatin (ZOCOR) 80 MG tablet Take 1 tablet (80 mg total) by mouth daily.  .Marland Kitchentriamterene-hydrochlorothiazide (MAXZIDE-25) 37.5-25 MG tablet Take 0.5 tablets by mouth daily.  .Marland Kitchenvenlafaxine (EFFEXOR) 75 MG tablet Take 1 tablet (75 mg total) by mouth 2 (two) times daily with a meal.  . zolpidem (AMBIEN) 10 MG tablet TAKE ONE-HALF TABLET AT BEDTIME   No facility-administered encounter medications on file as of 07/05/2016.     Allergies (verified) Codeine   History: Past Medical History:  Diagnosis Date  . Barrett's esophagus 2008  . [place]   Infiltrating ductal carcinoma  . Carpal tunnel syndrome 1990's  . Chicken pox   . Colon polyps 2006  . Depression   . Fat necrosis of breast 2007   Left  . GERD (gastroesophageal reflux disease)   . Hyperlipidemia   . Hypertension 2013  . Malignant neoplasm of breast (female), unspecified site Oct 2014   , 4 mm. T1a,Nx; ER 90%, PR 40%, HER-2/neu not amplified. [facility]estrogen [facility]   Past Surgical History:  Procedure Laterality Date  . ABDOMINAL HYSTERECTOMY  1997  . APPENDECTOMY  1970  . BREAST  REDUCTION SURGERY  2004  . BREAST SURGERY Right 1990   infiltrating ductal carcinoma  . BREAST SURGERY [place]ilateral 12-28-12   [place]  [place]   [facility]  . CHOLECYSTECTOMY  1970  . COLONOSCOPY  March 2014   4 rectal polyps, one tubular adenoma. Followup exam due in 2019.  . kidney biospy    . MASTECTOMY  2014  . TONSILLECTOMY  1967  . UPPER GI ENDOSCOPY  February 2014   No Barrett's epithelium reported.   Family History  Problem Relation Age of Onset  . Colon cancer Father 653 . Colon cancer Mother 686 . Hyperlipidemia Mother   . Hypertension Mother   . Kidney disease Mother   . Colon cancer Sister 484 . Diabetes Sister   . Diabetes Brother    Social History   Occupational History  . Not on file.   Social History Main Topics  . Smoking status: Never Smoker  . Smokeless tobacco: Never Used  . Alcohol use 2.4 oz/week    4 Glasses of wine per week  . Drug use: No  . Sexual activity: No    Tobacco Counseling Counseling given: Not Answered   Activities of Daily Living In your present state of health, do you have any difficulty performing the following activities: 07/05/2016  Hearing? N  Vision? N  Difficulty concentrating or making decisions? N  Walking or climbing stairs? N  Dressing or bathing? N  Doing errands, shopping? N  Preparing Food and eating ? N  Using the Toilet? N  In the past six months, have you accidently leaked urine? N  Do you have problems with loss of bowel control? N  Managing your Medications? N  Managing your Finances? N  Housekeeping or managing your Housekeeping? N  Some recent data might be hidden    Immunizations and Health Maintenance  There is no immunization history on file for this patient. Health Maintenance Due  Topic Date Due  . Hepatitis C Screening  October 02, 1950    Patient Care Team: Coral Spikes, DO as PCP - General (Family Medicine) Robert Bellow, MD  (General Surgery) Addison Naegeli, MD (Family Medicine)  Indicate any recent Medical Services you may have received from other than [facility] providers in the past year (date may be approximate).     Assessment:   This is a routine wellness examination for [facility]. The goal of the wellness visit is to assist the patient how to close the gaps in care and create a preventative care plan for the patient.   Osteoporosis risk reviewed.  Medications reviewed; taking without issues or barriers.  Safety issues reviewed; smoke detectors in the home. No firearms in the home.  Wears seatbelts when driving or riding with others. Patient does wear sunscreen or protective clothing when in direct sunlight. No violence in the home.  Patient is alert, normal appearance, oriented to person/place/and time. Correctly identified the president of the [place], recall of 3/3 words, and performing simple calculations.  Patient displays appropriate judgement and can read correct time from watch face.  No new identified risk were noted.  No failures at ADL's or IADL's.   BMI- discussed the importance of a healthy diet, water intake and exercise. Educational material provided.   HTN- followed by PCP.  Dental- every six months.  Dr. Cassell Clement.  Eye- Visual acuity not assessed per patient preference since they have regular follow up with the ophthalmologist.  Wears corrective lenses for reading.  Sleep patterns- Sleeps 3-4 hours at night.  Wakes feeling fatigued. Taking medication as prescribed. TSH lab completed per verbal order.  Hepatitis C Screening discussed, educational material provided.  Patient Concerns: None at this time. Follow up with PCP as needed.  Hearing/Vision screen Hearing Screening Comments: Patient is able to hear conversational tones without difficulty.  No issues reported.   Vision Screening Comments: Followed by Dr. Kathyrn Lass  [facility]) Wears corrective lenses for reading Last OV  2017 Cataract extraction, bilateral Visual acuity not assessed per patient preference since they have regular follow up with the ophthalmologist  Dietary issues and exercise activities discussed: Current Exercise Habits: Home exercise routine, Type of exercise: walking;calisthenics, Time (Minutes): 20, Frequency (Times/Week): 2, Weekly Exercise (Minutes/Week): 40, Intensity: Mild  Goals    . Increase lean proteins          Low carb foods. Educational material provided.    . Increase physical activity          Stay active and walk/aerobic class for exercise    . Increase water intake          Stay hydrated      Depression Screen PHQ 2/9 Scores 07/05/2016  PHQ - 2 Score 0    Fall Risk Fall Risk  07/05/2016  Falls in the past year? No    Cognitive Function:  MMSE - Mini Mental State Exam 07/05/2016  Orientation to time 5  Orientation to Place 5  Registration 3  Attention/ Calculation 5  Recall 3  Language- name 2 objects 2  Language- repeat 1  Language- follow 3 step command 3  Language- read & follow direction 1  Write a sentence 1  Copy design 1  Total score 30        Screening Tests Health Maintenance  Topic Date Due  . Hepatitis C Screening  1950-03-22  . INFLUENZA VACCINE  12/07/2016 (Originally 10/16/2016)  . TETANUS/TDAP  07/15/2017 (Originally 05/22/1967)  . PNA vac Low Risk Adult (1 of 2 - PCV13) 07/15/2017 (Originally 05/21/2013)  . COLONOSCOPY  06/10/2022  . DEXA SCAN  Completed      Plan:    End of life planning; Advance aging; Advanced directives discussed. Copy of current HCPOA/Living Will requested.    Medicare Attestation I have personally reviewed: The patient's medical and social history Their use of alcohol, tobacco or illicit drugs Their current medications and supplements The patient's functional ability including ADLs,fall risks, home safety risks, cognitive, and hearing and visual impairment Diet and physical activities Evidence for  depression   The patient's weight, height, BMI, and visual acuity have been recorded in the chart.  I have made referrals and provided education to the patient based on review of the above and I have provided the patient with a written personalized care plan for preventive services.    During the course of the visit, Angelita was educated and counseled about the following appropriate screening and preventive services:   Vaccines to include Pneumoccal, Influenza, Hepatitis B, Td, Zostavax, HCV  Colorectal cancer screening-UTD  Bone density screening-UTD  Glaucoma screening-annual eye exam  Mammography-UTD  Nutrition counseling  Patient Instructions (the written plan) were given to the patient.    Varney Biles, LPN   08/09/8183

## 2016-07-06 LAB — TSH: TSH: 1.78 m[IU]/L

## 2016-09-16 ENCOUNTER — Other Ambulatory Visit: Payer: Self-pay | Admitting: Family Medicine

## 2016-09-17 MED ORDER — ZOLPIDEM TARTRATE 10 MG PO TABS
ORAL_TABLET | ORAL | 0 refills | Status: AC
Start: 2016-09-17 — End: ?

## 2017-01-23 ENCOUNTER — Ambulatory Visit (INDEPENDENT_AMBULATORY_CARE_PROVIDER_SITE_OTHER): Payer: Medicare Other | Admitting: General Surgery

## 2017-01-23 ENCOUNTER — Encounter: Payer: Self-pay | Admitting: General Surgery

## 2017-01-23 VITALS — BP 130/72 | HR 70 | Resp 12 | Ht 62.0 in | Wt 130.0 lb

## 2017-01-23 DIAGNOSIS — Z853 Personal history of malignant neoplasm of breast: Secondary | ICD-10-CM

## 2017-01-23 NOTE — Patient Instructions (Addendum)
Return on one year breast cancer follow up. The patient is aware to call back for any questions or concerns.

## 2017-01-23 NOTE — Progress Notes (Addendum)
[email or web address] on opnPatient ID: Erin Roberts, female   DOB: 1950/09/06, 66 y.o.   MRN: [id number]  Chief Complaint  Patient presents with  . Breast Cancer    HPI Erin Roberts is a 66 y.o. female here today for her one year breast cancer follow up. Patient states she is doing well.  HPI  Past Medical History:  Diagnosis Date  . BARD1 gene mutation positive 2014   Increased risk of breast cancer, no clear change in screening guidelines at time of testin.   . Barrett's esophagus 2008  . Breast cancer ([place]) 1990   Infiltrating ductal carcinoma  . Carpal tunnel syndrome 1990's  . Chicken pox   . Colon polyps 2006  . Depression   . Fat necrosis of breast 2007   Left  . GERD (gastroesophageal reflux disease)   . Hyperlipidemia   . Hypertension 2013  . Malignant neoplasm of breast (female), unspecified site 12/2012   Left, 4 mm. T1a,Nx; ER 90%, PR 40%, HER-2/neu not amplified. No indication for antiestrogen therapy after [facility] tumor board Presentation.    Past Surgical History:  Procedure Laterality Date  . ABDOMINAL HYSTERECTOMY  1997  . APPENDECTOMY  1970  . BREAST REDUCTION SURGERY  2004  . BREAST SURGERY Right 1990   infiltrating ductal carcinoma  . BREAST SURGERY Bilateral 12-28-12   mastectomy  . [place] 2004  . cataracts Bilateral 2017   [facility]  . CHOLECYSTECTOMY  1970  . COLONOSCOPY  March 2014   4 rectal polyps, one tubular adenoma. Followup exam due in 2019.  . kidney biospy    . MASTECTOMY  2014  . TONSILLECTOMY  1967  . UPPER GI ENDOSCOPY  February 2014   No Barrett's epithelium reported.    Family History  Problem Relation Age of Onset  . Colon cancer Father 8  . Colon cancer Mother 44  . Hyperlipidemia Mother   . Hypertension Mother   . Kidney disease Mother   . Colon cancer Sister 15  . Diabetes Sister   . Diabetes Brother     Social History Social History   Tobacco Use  . Smoking status: Never Smoker  . Smokeless  tobacco: Never Used  Substance Use Topics  . Alcohol use: Yes    Alcohol/week: 2.4 oz    Types: 4 Glasses of wine per week  . Drug use: No    Allergies  Allergen Reactions  . Codeine     REACTION: nausea/vomiting    Current Outpatient Medications  Medication Sig Dispense Refill  . clobetasol ointment (TEMOVATE) 8.41 % Apply 1 application topically 2 (two) times daily.    Marland Kitchen omeprazole (PRILOSEC) 20 MG capsule Take 1 capsule (20 mg total) by mouth 2 (two) times daily. 180 capsule 3  . simvastatin (ZOCOR) 80 MG tablet Take 1 tablet (80 mg total) by mouth daily. 90 tablet 3  . triamterene-hydrochlorothiazide (MAXZIDE-25) 37.5-25 MG tablet Take 0.5 tablets by mouth daily. 45 tablet 3  . venlafaxine (EFFEXOR) 75 MG tablet Take 1 tablet (75 mg total) by mouth 2 (two) times daily with a meal. 180 tablet 3  . zolpidem (AMBIEN) 10 MG tablet TAKE ONE-HALF TABLET AT BEDTIME 90 tablet 0   No current facility-administered medications for this visit.     Review of Systems Review of Systems  Constitutional: Negative.   Respiratory: Negative.   Cardiovascular: Negative.     Blood pressure 130/72, pulse 70, resp. rate 12, height '5\' 2"'$  (1.575 m),  weight 130 lb (59 kg).  Physical Exam Physical Exam  Constitutional: She is oriented to person, place, and time. She appears well-developed and well-nourished.  Eyes: Conjunctivae are normal. No scleral icterus.  Neck: Neck supple.  Cardiovascular: Normal rate, regular rhythm and normal heart sounds.  Pulmonary/Chest: Effort normal and breath sounds normal.    Bilateral mastectomy sites are clean and well healed.   Lymphadenopathy:    She has no cervical adenopathy.    She has no axillary adenopathy.       Right: No supraclavicular adenopathy present.       Left: No supraclavicular adenopathy present.  Neurological: She is alert and oriented to person, place, and time.  Skin: Skin is warm and dry.       Assessment    No evidence of  recurrent breast cancer.    Plan    Patient was previously tested for BRCA abnormality and was found to be normal.  Review of her family history is notable for 3 first-degree relatives with colon cancer, with one developing at age 10. She may be a candidate for genetic testing. This will be reviewed.  Genetic testing completed in 2015. BARD 1 mutation, increased risk of breast cancer. No clear special screening guidelines for family members.  Candidate for follow-up colonoscopy in 2019.    Patient to return in one year breast cancer check. Patient would like to know about the colon test.     The patient is aware to call back for any questions or concerns.  HPI, Physical Exam, Assessment and Plan have been scribed under the direction and in the presence of Hervey Ard, MD.  Gaspar Cola, CMA  I have completed the exam and reviewed the above documentation for accuracy and completeness.  I agree with the above.  Haematologist has been used and any errors in dictation or transcription are unintentional.  Hervey Ard, M.D., F.A.C.S.  Robert Bellow 01/29/2017, 8:48 AM

## 2017-01-29 ENCOUNTER — Encounter: Payer: Self-pay | Admitting: General Surgery

## 2017-01-29 ENCOUNTER — Telehealth: Payer: Self-pay | Admitting: *Deleted

## 2017-01-29 NOTE — Telephone Encounter (Signed)
Notified patient as instructed, patient pleased. Discussed follow-up appointments, patient agrees  

## 2017-01-29 NOTE — Telephone Encounter (Signed)
-----  Message from Robert Bellow, MD sent at 01/29/2017  8:49 AM EST ----- Let her know testing was done in 2015.  Daughters (if any) may be at higher risk of breast cancer, but no clear guidelines outside of regular mammograms. No increased risk for female children.  ----- Message ----- From: Carson Myrtle, RN Sent: 01/28/2017   4:08 PM To: Robert Bellow, MD  She had MyRisk done 07-07-13, positive BARD1, scanned in her chart under the Media tab. Thanks Rosann Auerbach ----- Message ----- From: Robert Bellow, MD Sent: 01/23/2017   9:26 PM To: Carson Myrtle, RN  See if patient qualifies for genetic testing: 3 first degree relatives with colon cancer, sister at age 41. Thanks.

## 2017-02-04 ENCOUNTER — Other Ambulatory Visit: Payer: Self-pay | Admitting: Family Medicine

## 2017-02-04 DIAGNOSIS — D369 Benign neoplasm, unspecified site: Secondary | ICD-10-CM | POA: Insufficient documentation

## 2017-02-04 DIAGNOSIS — Z800 Family history of malignant neoplasm of digestive organs: Secondary | ICD-10-CM | POA: Insufficient documentation

## 2017-07-08 ENCOUNTER — Ambulatory Visit: Payer: Medicare Other

## 2017-08-05 DIAGNOSIS — E538 Deficiency of other specified B group vitamins: Secondary | ICD-10-CM | POA: Insufficient documentation

## 2017-08-19 ENCOUNTER — Encounter: Payer: Self-pay | Admitting: General Surgery

## 2017-10-09 ENCOUNTER — Ambulatory Visit: Payer: Medicare Other | Admitting: General Surgery

## 2017-10-14 ENCOUNTER — Encounter: Payer: Self-pay | Admitting: General Surgery

## 2017-10-14 ENCOUNTER — Ambulatory Visit (INDEPENDENT_AMBULATORY_CARE_PROVIDER_SITE_OTHER): Payer: Medicare Other | Admitting: General Surgery

## 2017-10-14 VITALS — BP 124/68 | HR 98 | Resp 12 | Ht 65.0 in | Wt 125.0 lb

## 2017-10-14 DIAGNOSIS — Z86010 Personal history of colonic polyps: Secondary | ICD-10-CM | POA: Diagnosis not present

## 2017-10-14 MED ORDER — METOCLOPRAMIDE HCL 5 MG PO TABS: 5.0000 mg | ORAL_TABLET | ORAL | 0 refills | Status: DC

## 2017-10-14 MED ORDER — POLYETHYLENE GLYCOL 3350 17 GM/SCOOP PO POWD: 1.0000 | Freq: Once | ORAL | 0 refills | Status: AC

## 2017-10-14 NOTE — Progress Notes (Signed)
rePatient ID: Erin Roberts, female   DOB: December 06, 1950, 67 y.o.   MRN: [id number]  Chief Complaint  Patient presents with  . Colonoscopy    HPI Erin VEITH is a 67 y.o. female here today for a evalaution of a colonoscopy. Last colonoscopy was done in 2014. Patient reports no GI problems at this time. Move her bowels daily  HPI  Past Medical History:  Diagnosis Date  . BARD1 gene mutation positive 08/20/2013   Increased risk of breast cancer, no clear change in screening guidelines at time of testin.   . Barrett's esophagus 2008  . Breast cancer ([place]) 1990   Infiltrating ductal carcinoma  . Carpal tunnel syndrome 1990's  . Chicken pox   . Colon polyps 2006  . Depression   . Fat necrosis of breast 2007   Left  . GERD (gastroesophageal reflux disease)   . Hyperlipidemia   . Hypertension 2013  . Malignant neoplasm of breast (female), unspecified site 12/2012   Left, 4 mm. T1a,Nx; ER 90%, PR 40%, HER-2/neu not amplified. No indication for antiestrogen therapy after [facility] tumor board Presentation.    Past Surgical History:  Procedure Laterality Date  . ABDOMINAL HYSTERECTOMY  1997  . APPENDECTOMY  1970  . BREAST REDUCTION SURGERY  2004  . BREAST SURGERY Right 1990   infiltrating ductal carcinoma  . BREAST SURGERY Bilateral 12-28-12   mastectomy  . [place], 2004  . cataracts Bilateral 2017   [facility]  . CHOLECYSTECTOMY  1970  . COLONOSCOPY  March 2014   4 rectal polyps, one tubular adenoma. Followup exam due in 2019.  . kidney biospy    . MASTECTOMY  2014  . TONSILLECTOMY  1967  . UPPER GI ENDOSCOPY  February 2014   No Barrett's epithelium reported.    Family History  Problem Relation Age of Onset  . Colon cancer Father 67  . Colon cancer Mother 35  . Hyperlipidemia Mother   . Hypertension Mother   . Kidney disease Mother   . Colon cancer Sister 3  . Diabetes Sister   . Diabetes Brother     Social History Social History   Tobacco  Use  . Smoking status: Never Smoker  . Smokeless tobacco: Never Used  Substance Use Topics  . Alcohol use: Yes    Alcohol/week: 2.4 oz    Types: 4 Glasses of wine per week  . Drug use: No    Allergies  Allergen Reactions  . Codeine     REACTION: nausea/vomiting    Current Outpatient Medications  Medication Sig Dispense Refill  . clobetasol ointment (TEMOVATE) 6.83 % Apply 1 application topically 2 (two) times daily.    Marland Kitchen omeprazole (PRILOSEC) 20 MG capsule Take 1 capsule (20 mg total) by mouth 2 (two) times daily. 180 capsule 3  . simvastatin (ZOCOR) 80 MG tablet Take 1 tablet (80 mg total) by mouth daily. 90 tablet 3  . triamterene-hydrochlorothiazide (MAXZIDE-25) 37.5-25 MG tablet Take 0.5 tablets by mouth daily. 45 tablet 3  . venlafaxine (EFFEXOR) 75 MG tablet Take 1 tablet (75 mg total) by mouth 2 (two) times daily with a meal. 180 tablet 3  . zolpidem (AMBIEN) 10 MG tablet TAKE ONE-HALF TABLET AT BEDTIME 90 tablet 0  . metoCLOPramide (REGLAN) 5 MG tablet Take 1 tablet (5 mg total) by mouth as directed. Take 2-38m tablets by mouth 30-60 minutes prior to starting your prep. You may take two more tablets if needed for nausea. 4  tablet 0   No current facility-administered medications for this visit.     Review of Systems Review of Systems  Constitutional: Negative.   Respiratory: Negative.   Cardiovascular: Negative.   Gastrointestinal: Negative.     Blood pressure 124/68, pulse 98, resp. rate 12, height _0  (1.651 m), weight 125 lb (56.7 kg).  Physical Exam Physical Exam  Constitutional: She is oriented to person, place, and time. She appears well-developed and well-nourished.  Cardiovascular: Normal rate, regular rhythm and normal heart sounds.  Pulmonary/Chest: Effort normal and breath sounds normal.  Neurological: She is alert and oriented to person, place, and time.  Skin: Skin is warm and dry.    Data Reviewed June 10, 2012 colonoscopy: RECTAL POLYP X 4 AT  15 CM HOT SNARED:  - TUBULAR ADENOMA, 1 FRAGMENT, NEGATIVE FOR HIGH GRADE DYSPLASIA  AND MALIGNANCY.  - HYPERPLASTIC POLYPS, 3 FRAGMENTS.   Assessment    Candidate for follow-up colonoscopy.    Plan    Colonoscopy with possible biopsy/polypectomy prn: Information regarding the procedure, including its potential risks and complications (including but not limited to perforation of the bowel, which may require emergency surgery to repair, and bleeding) was verbally given to the patient. Educational information regarding lower intestinal endoscopy was given to the patient. Written instructions for how to complete the bowel prep using Miralax were provided. The importance of drinking ample fluids to avoid dehydration as a result of the prep emphasized.  HPI, Physical Exam, Assessment and Plan have been scribed under the direction and in the presence of Hervey Ard, MD. Gaspar Roberts, CMA  I have completed the exam and reviewed the above documentation for accuracy and completeness.  I agree with the above.  Haematologist has been used and any errors in dictation or transcription are unintentional.  Hervey Ard, M.D., F.A.C.S.  The patient is scheduled for a Colonoscopy at [facility] on 11/12/17. They are aware to call the day before to get their arrival time. She will make use of Reglan the daybefore  30-60 minutes prior to starting the prep. Miralax and Reglan prescriptions has been sent into the patient's pharmacy. The patient is aware of date and instructions.  Documented by Caryl-Lyn Otis Brace LPN   Forest Gleason Kamilla Hands 10/15/2017, 9:02 PM

## 2017-10-14 NOTE — Patient Instructions (Addendum)
Colonoscopy, Adult A colonoscopy is an exam to look at the entire large intestine. During the exam, a lubricated, bendable tube is inserted into the anus and then passed into the rectum, colon, and other parts of the large intestine. A colonoscopy is often done as a part of normal colorectal screening or in response to certain symptoms, such as anemia, persistent diarrhea, abdominal pain, and blood in the stool. The exam can help screen for and diagnose medical problems, including:  Tumors.  Polyps.  Inflammation.  Areas of bleeding.  Tell a health care provider about:  Any allergies you have.  All medicines you are taking, including vitamins, herbs, eye drops, creams, and over-the-counter medicines.  Any problems you or family members have had with anesthetic medicines.  Any blood disorders you have.  Any surgeries you have had.  Any medical conditions you have.  Any problems you have had passing stool. What are the risks? Generally, this is a safe procedure. However, problems may occur, including:  Bleeding.  A tear in the intestine.  A reaction to medicines given during the exam.  Infection (rare).  What happens before the procedure? Eating and drinking restrictions Follow instructions from your health care provider about eating and drinking, which may include:  A few days before the procedure - follow a low-fiber diet. Avoid nuts, seeds, dried fruit, raw fruits, and vegetables.  1-3 days before the procedure - follow a clear liquid diet. Drink only clear liquids, such as clear broth or bouillon, black coffee or tea, clear juice, clear soft drinks or sports drinks, gelatin dessert, and popsicles. Avoid any liquids that contain red or purple dye.  On the day of the procedure - do not eat or drink anything during the 2 hours before the procedure, or within the time period that your health care provider recommends.  Bowel prep If you were prescribed an oral bowel prep  to clean out your colon:  Take it as told by your health care provider. Starting the day before your procedure, you will need to drink a large amount of medicated liquid. The liquid will cause you to have multiple loose stools until your stool is almost clear or light green.  If your skin or anus gets irritated from diarrhea, you may use these to relieve the irritation: ? Medicated wipes, such as adult wet wipes with aloe and vitamin E. ? A skin soothing-product like petroleum jelly.  If you vomit while drinking the bowel prep, take a break for up to 60 minutes and then begin the bowel prep again. If vomiting continues and you cannot take the bowel prep without vomiting, call your health care provider.  General instructions  Ask your health care provider about changing or stopping your regular medicines. This is especially important if you are taking diabetes medicines or blood thinners.  Plan to have someone take you home from the hospital or clinic. What happens during the procedure?  An IV tube may be inserted into one of your veins.  You will be given medicine to help you relax (sedative).  To reduce your risk of infection: ? Your health care team will wash or sanitize their hands. ? Your anal area will be washed with soap.  You will be asked to lie on your side with your knees bent.  Your health care provider will lubricate a long, thin, flexible tube. The tube will have a camera and a light on the end.  The tube will be inserted into your   anus.  The tube will be gently eased through your rectum and colon.  Air will be delivered into your colon to keep it open. You may feel some pressure or cramping.  The camera will be used to take images during the procedure.  A small tissue sample may be removed from your body to be examined under a microscope (biopsy). If any potential problems are found, the tissue will be sent to a lab for testing.  If small polyps are found, your  health care provider may remove them and have them checked for cancer cells.  The tube that was inserted into your anus will be slowly removed. The procedure may vary among health care providers and hospitals. What happens after the procedure?  Your blood pressure, heart rate, breathing rate, and blood oxygen level will be monitored until the medicines you were given have worn off.  Do not drive for 24 hours after the exam.  You may have a small amount of blood in your stool.  You may pass gas and have mild abdominal cramping or bloating due to the air that was used to inflate your colon during the exam.  It is up to you to get the results of your procedure. Ask your health care provider, or the department performing the procedure, when your results will be ready. This information is not intended to replace advice given to you by your health care provider. Make sure you discuss any questions you have with your health care provider. Document Released: 03/01/2000 Document Revised: 01/03/2016 Document Reviewed: 05/16/2015 Elsevier Interactive Patient Education  [employer].   The patient is scheduled for a Colonoscopy at [facility] on 11/12/17. They are aware to call the day before to get their arrival time. She will make use of Reglan the daybefore  30-60 minutes prior to starting the prep. Miralax and Reglan prescriptions has been sent into the patient's pharmacy. The patient is aware of date and instructions.

## 2017-10-15 DIAGNOSIS — Z860100 Personal history of colon polyps, unspecified: Secondary | ICD-10-CM | POA: Insufficient documentation

## 2017-11-12 ENCOUNTER — Ambulatory Visit: Payer: Medicare Other | Admitting: Anesthesiology

## 2017-11-12 ENCOUNTER — Encounter: Payer: Self-pay | Admitting: *Deleted

## 2017-11-12 ENCOUNTER — Encounter
Admission: RE | Disposition: A | Discharge status: Home / Self Care | Payer: Self-pay | Source: Ambulatory Visit | Attending: General Surgery

## 2017-11-12 ENCOUNTER — Ambulatory Visit
Admission: RE | Admit: 2017-11-12 | Discharge: 2017-11-12 | Disposition: A | Discharge status: Home / Self Care | Payer: Medicare Other | Source: Ambulatory Visit | Attending: General Surgery | Admitting: General Surgery

## 2017-11-12 DIAGNOSIS — G709 Myoneural disorder, unspecified: Secondary | ICD-10-CM | POA: Diagnosis not present

## 2017-11-12 DIAGNOSIS — K621 Rectal polyp: Secondary | ICD-10-CM

## 2017-11-12 DIAGNOSIS — Z1211 Encounter for screening for malignant neoplasm of colon: Secondary | ICD-10-CM | POA: Diagnosis not present

## 2017-11-12 DIAGNOSIS — Z885 Allergy status to narcotic agent status: Secondary | ICD-10-CM | POA: Insufficient documentation

## 2017-11-12 DIAGNOSIS — I10 Essential (primary) hypertension: Secondary | ICD-10-CM | POA: Insufficient documentation

## 2017-11-12 DIAGNOSIS — Z853 Personal history of malignant neoplasm of breast: Secondary | ICD-10-CM | POA: Insufficient documentation

## 2017-11-12 DIAGNOSIS — I739 Peripheral vascular disease, unspecified: Secondary | ICD-10-CM | POA: Diagnosis not present

## 2017-11-12 DIAGNOSIS — Z9049 Acquired absence of other specified parts of digestive tract: Secondary | ICD-10-CM | POA: Diagnosis not present

## 2017-11-12 DIAGNOSIS — D123 Benign neoplasm of transverse colon: Secondary | ICD-10-CM | POA: Diagnosis not present

## 2017-11-12 DIAGNOSIS — K219 Gastro-esophageal reflux disease without esophagitis: Secondary | ICD-10-CM | POA: Diagnosis not present

## 2017-11-12 DIAGNOSIS — Z90710 Acquired absence of both cervix and uterus: Secondary | ICD-10-CM | POA: Diagnosis not present

## 2017-11-12 DIAGNOSIS — Z148 Genetic carrier of other disease: Secondary | ICD-10-CM | POA: Insufficient documentation

## 2017-11-12 DIAGNOSIS — Z86010 Personal history of colonic polyps: Secondary | ICD-10-CM | POA: Insufficient documentation

## 2017-11-12 DIAGNOSIS — K5730 Diverticulosis of large intestine without perforation or abscess without bleeding: Secondary | ICD-10-CM | POA: Diagnosis not present

## 2017-11-12 DIAGNOSIS — Z79899 Other long term (current) drug therapy: Secondary | ICD-10-CM | POA: Insufficient documentation

## 2017-11-12 DIAGNOSIS — E785 Hyperlipidemia, unspecified: Secondary | ICD-10-CM | POA: Diagnosis not present

## 2017-11-12 HISTORY — PX: COLONOSCOPY WITH PROPOFOL: SHX5780

## 2017-11-12 SURGERY — COLONOSCOPY WITH PROPOFOL
Anesthesia: General

## 2017-11-12 MED ORDER — PROPOFOL 500 MG/50ML IV EMUL
INTRAVENOUS | Status: AC
  Filled 2017-11-12: qty 50

## 2017-11-12 MED ORDER — FENTANYL CITRATE (PF) 100 MCG/2ML IJ SOLN
INTRAMUSCULAR | Status: AC
  Filled 2017-11-12: qty 2

## 2017-11-12 MED ORDER — SODIUM CHLORIDE 0.9 % IV SOLN
INTRAVENOUS | Status: DC
  Administered 2017-11-12: 10:00:00 via INTRAVENOUS

## 2017-11-12 MED ORDER — FENTANYL CITRATE (PF) 100 MCG/2ML IJ SOLN
INTRAMUSCULAR | Status: DC | PRN
  Administered 2017-11-12 (×2): 50 ug via INTRAVENOUS

## 2017-11-12 MED ORDER — PROPOFOL 10 MG/ML IV BOLUS
INTRAVENOUS | Status: DC | PRN
  Administered 2017-11-12: 100 mg via INTRAVENOUS

## 2017-11-12 MED ORDER — LIDOCAINE HCL (PF) 2 % IJ SOLN
INTRAMUSCULAR | Status: AC
  Filled 2017-11-12: qty 10

## 2017-11-12 MED ORDER — LIDOCAINE 2% (20 MG/ML) 5 ML SYRINGE
INTRAMUSCULAR | Status: DC | PRN
  Administered 2017-11-12: 30 mg via INTRAVENOUS

## 2017-11-12 MED ORDER — PROPOFOL 500 MG/50ML IV EMUL
INTRAVENOUS | Status: DC | PRN
  Administered 2017-11-12: 140 ug/kg/min via INTRAVENOUS

## 2017-11-12 NOTE — Anesthesia Post-op Follow-up Note (Signed)
Anesthesia QCDR form completed.        

## 2017-11-12 NOTE — Transfer of Care (Signed)
Immediate Anesthesia Transfer of Care Note  Patient: Erin Roberts  Procedure(s) Performed: COLONOSCOPY WITH PROPOFOL (N/A )  Patient Location: PACU and Endoscopy Unit  Anesthesia Type:General  Level of Consciousness: awake and patient cooperative  Airway & Oxygen Therapy: Patient Spontanous Breathing  Post-op Assessment: Report given to RN and Post -op Vital signs reviewed and stable  Post vital signs: Reviewed and stable  Last Vitals:  Vitals Value Taken Time  BP 139/92 11/12/2017 10:53 AM  Temp    Pulse 81 11/12/2017 10:53 AM  Resp 16 11/12/2017 10:53 AM  SpO2 100 % 11/12/2017 10:53 AM  Vitals shown include unvalidated device data.  Last Pain:  Vitals:   11/12/17 0944  TempSrc: Tympanic  PainSc: 0-No pain         Complications: No apparent anesthesia complications

## 2017-11-12 NOTE — H&P (Signed)
Erin Roberts [id number] [birth year]     HPI: 67 year old woman with past history of a tubular adenoma in the rectum at the time of 2014 colonoscopy.  For repeat exam.  Tolerated prep well.  Medications Prior to Admission  Medication Sig Dispense Refill Last Dose  . metoCLOPramide (REGLAN) 5 MG tablet Take 1 tablet (5 mg total) by mouth as directed. Take 2-'5mg'$  tablets by mouth 30-60 minutes prior to starting your prep. You may take two more tablets if needed for nausea. 4 tablet 0 11/11/2017 at Unknown time  . omeprazole (PRILOSEC) 20 MG capsule Take 1 capsule (20 mg total) by mouth 2 (two) times daily. 180 capsule 3 11/11/2017 at Unknown time  . simvastatin (ZOCOR) 80 MG tablet Take 1 tablet (80 mg total) by mouth daily. 90 tablet 3 11/11/2017 at Unknown time  . triamterene-hydrochlorothiazide (MAXZIDE-25) 37.5-25 MG tablet Take 0.5 tablets by mouth daily. 45 tablet 3 11/12/2017 at Unknown time  . venlafaxine (EFFEXOR) 75 MG tablet Take 1 tablet (75 mg total) by mouth 2 (two) times daily with a meal. 180 tablet 3 11/11/2017 at Unknown time  . zolpidem (AMBIEN) 10 MG tablet TAKE ONE-HALF TABLET AT BEDTIME 90 tablet 0 11/11/2017 at Unknown time  . clobetasol ointment (TEMOVATE) 4.16 % Apply 1 application topically 2 (two) times daily.   Taking   Allergies  Allergen Reactions  . Codeine     REACTION: nausea/vomiting   Past Medical History:  Diagnosis Date  . BARD1 gene mutation positive 08/20/2013   Increased risk of breast cancer, no clear change in screening guidelines at time of testin.   . Barrett's esophagus 2008  . Breast cancer ([place]) 1990   Infiltrating ductal carcinoma  . Carpal tunnel syndrome 1990's  . Chicken pox   . Colon polyps 2006  . Depression   . Fat necrosis of breast 2007   Left  . GERD (gastroesophageal reflux disease)   . Hyperlipidemia   . Hypertension 2013  . Malignant neoplasm of breast (female), unspecified site 12/2012   Left, 4 mm. T1a,Nx; ER 90%, PR 40%,  HER-2/neu not amplified. No indication for antiestrogen therapy after [facility] tumor board Presentation.   Past Surgical History:  Procedure Laterality Date  . ABDOMINAL HYSTERECTOMY  1997  . APPENDECTOMY  1970  . BREAST REDUCTION SURGERY  2004  . BREAST SURGERY Right 1990   infiltrating ductal carcinoma  . BREAST SURGERY Bilateral 12-28-12   mastectomy  . [place], 2004  . cataracts Bilateral 2017   [facility]  . CHOLECYSTECTOMY  1970  . COLONOSCOPY  March 2014   4 rectal polyps, one tubular adenoma. Followup exam due in 2019.  . kidney biospy    . MASTECTOMY  2014  . TONSILLECTOMY  1967  . UPPER GI ENDOSCOPY  February 2014   No Barrett's epithelium reported.   Social History   Socioeconomic History  . Marital status: Married    Spouse name: Not on file  . Number of children: Not on file  . Years of education: Not on file  . Highest education level: Not on file  Occupational History  . Not on file  Social Needs  . Financial resource strain: Not on file  . Food insecurity:    Worry: Not on file    Inability: Not on file  . Transportation needs:    Medical: Not on file    Non-medical: Not on file  Tobacco Use  . Smoking status: Never Smoker  .  Smokeless tobacco: Never Used  Substance and Sexual Activity  . Alcohol use: Yes    Alcohol/week: 4.0 standard drinks    Types: 4 Glasses of wine per week  . Drug use: No  . Sexual activity: Never  Lifestyle  . Physical activity:    Days per week: Not on file    Minutes per session: Not on file  . Stress: Not on file  Relationships  . Social connections:    Talks on phone: Not on file    Gets together: Not on file    Attends religious service: Not on file    Active member of club or organization: Not on file    Attends meetings of clubs or organizations: Not on file    Relationship status: Not on file  . Intimate partner violence:    Fear of current or ex partner: Not on file    Emotionally abused: Not on  file    Physically abused: Not on file    Forced sexual activity: Not on file  Other Topics Concern  . Not on file  Social History Narrative   Newly retired    Lives with husband    Children 1    Pets: None   Caffeine- Tea 2 cups, 1/2 cup of coffee    Social History   Social History Narrative   Newly retired    Lives with husband    Children 1    Pets: None   Caffeine- Tea 2 cups, 1/2 cup of coffee      ROS: Negative.     PE: HEENT: Negative. Lungs: Clear. Cardio: RR.  Assessment/Plan:  Proceed with planned endoscopy.  Forest Gleason [facility] 11/12/2017

## 2017-11-12 NOTE — Anesthesia Preprocedure Evaluation (Signed)
Anesthesia Evaluation  Patient identified by MRN, date of birth, ID band Patient awake    Reviewed: Allergy & Precautions, H&P , NPO status , Patient's Chart, lab work & pertinent test results, reviewed documented beta blocker date and time   Airway Mallampati: II   Neck ROM: full    Dental  (+) Poor Dentition   Pulmonary neg pulmonary ROS,    Pulmonary exam normal        Cardiovascular hypertension, On Medications + Peripheral Vascular Disease  negative cardio ROS Normal cardiovascular exam Rhythm:regular Rate:Normal     Neuro/Psych  Neuromuscular disease negative neurological ROS  negative psych ROS   GI/Hepatic negative GI ROS, Neg liver ROS, GERD  Medicated,  Endo/Other  negative endocrine ROS  Renal/GU negative Renal ROS  negative genitourinary   Musculoskeletal   Abdominal   Peds  Hematology negative hematology ROS (+)   Anesthesia Other Findings Past Medical History: 08/20/2013: BARD1 gene mutation positive     Comment:  Increased risk of breast cancer, no clear change in               screening guidelines at time of testin.  2008: Barrett's esophagus 1990: Breast cancer ([place])     Comment:  Infiltrating ductal carcinoma 1990's: Carpal tunnel syndrome No date: Chicken pox 2006: Colon polyps No date: Depression 2007: Fat necrosis of breast     Comment:  Left No date: GERD (gastroesophageal reflux disease) No date: Hyperlipidemia 2013: Hypertension 12/2012: Malignant neoplasm of breast (female), unspecified site     Comment:  Left, 4 mm. T1a,Nx; ER 90%, PR 40%, HER-2/neu not               amplified. No indication for antiestrogen therapy after               [facility] tumor board Presentation. Past Surgical History: 1997: ABDOMINAL HYSTERECTOMY 1970: APPENDECTOMY 2004: BREAST REDUCTION SURGERY 1990: BREAST SURGERY; Right     Comment:  infiltrating ductal carcinoma 12-28-12: BREAST SURGERY; Bilateral     Comment:  mastectomy 1998, 2004: CARPAL TUNNEL RELEASE 2017: cataracts; Bilateral     Comment:  [facility] 1970: CHOLECYSTECTOMY March 2014: COLONOSCOPY     Comment:  4 rectal polyps, one tubular adenoma. Followup exam due               in 2019. No date: kidney biospy 2014: MASTECTOMY 1967: TONSILLECTOMY February 2014: UPPER GI ENDOSCOPY     Comment:  No Barrett's epithelium reported.   Reproductive/Obstetrics negative OB ROS                             Anesthesia Physical Anesthesia Plan  ASA: III  Anesthesia Plan: General   Post-op Pain Management:    Induction:   PONV Risk Score and Plan:   Airway Management Planned:   Additional Equipment:   Intra-op Plan:   Post-operative Plan:   Informed Consent: I have reviewed the patients History and Physical, chart, labs and discussed the procedure including the risks, benefits and alternatives for the proposed anesthesia with the patient or authorized representative who has indicated his/her understanding and acceptance.   Dental Advisory Given  Plan Discussed with: CRNA  Anesthesia Plan Comments:         Anesthesia Quick Evaluation

## 2017-11-12 NOTE — Op Note (Signed)
[facility] Gastroenterology Patient Name: Erin Roberts Procedure Date: 11/12/2017 10:04 AM MRN: [id number] Account #: [id number] Date of Birth: 04-17-1950 Admit Type: Outpatient Age: 67 Room: [facility] ENDO ROOM 1 Gender: Female Note Status: Finalized Procedure:            Colonoscopy Indications:          High risk colon cancer surveillance: Personal history                        of colonic polyps Providers:            Robert Bellow, MD Referring MD:         Rusty Aus, MD (Referring MD) Medicines:            Monitored Anesthesia Care Complications:        No immediate complications. Procedure:            Pre-Anesthesia Assessment:                       - Prior to the procedure, a History and Physical was                        performed, and patient medications, allergies and                        sensitivities were reviewed. The patient's tolerance of                        previous anesthesia was reviewed.                       - The risks and benefits of the procedure and the                        sedation options and risks were discussed with the                        patient. All questions were answered and informed                        consent was obtained.                       After obtaining informed consent, the colonoscope was                        passed under direct vision. Throughout the procedure,                        the patient's blood pressure, pulse, and oxygen                        saturations were monitored continuously. The                        Colonoscope was introduced through the anus and                        advanced to the the cecum, identified by appendiceal  orifice and ileocecal valve. The colonoscopy was                        somewhat difficult due to restricted mobility of the                        colon. Successful completion of the procedure was aided                        by  changing the patient to a supine position. The                        patient tolerated the procedure well. The quality of                        the bowel preparation was excellent. Findings:      A few medium-mouthed diverticula were found in the sigmoid colon.      Three sessile polyps were found in the rectum and hepatic flexure. The       polyps were 5 mm in size. These were biopsied with a cold large-capacity       forceps for histology.      The retroflexed view of the distal rectum and anal verge was normal and       showed no anal or rectal abnormalities. Impression:           - Diverticulosis in the sigmoid colon.                       - Three 5 mm polyps in the rectum and at the hepatic                        flexure. Biopsied.                       - The distal rectum and anal verge are normal on                        retroflexion view. Recommendation:       - Telephone endoscopist for pathology results in 1 week. Procedure Code(s):    --- Professional ---                       [phone], Colonoscopy, flexible; with biopsy, single or                        multiple Diagnosis Code(s):    --- Professional ---                       Z86.010, Personal history of colonic polyps                       K62.1, Rectal polyp                       D12.3, Benign neoplasm of transverse colon (hepatic                        flexure or splenic flexure)  K57.30, Diverticulosis of large intestine without                        perforation or abscess without bleeding CPT copyright 2017 American Medical Association. All rights reserved. The codes documented in this report are preliminary and upon coder review may  be revised to meet current compliance requirements. Robert Bellow, MD 11/12/2017 10:51:56 AM This report has been signed electronically. Number of Addenda: 0 Note Initiated On: 11/12/2017 10:04 AM Scope Withdrawal Time: 0 hours 10 minutes 45 seconds  Total  Procedure Duration: 0 hours 22 minutes 16 seconds       [facility]

## 2017-11-13 ENCOUNTER — Encounter: Payer: Self-pay | Admitting: General Surgery

## 2017-11-13 NOTE — Anesthesia Postprocedure Evaluation (Signed)
Anesthesia Post Note  Patient: TARRAH FURUTA  Procedure(s) Performed: COLONOSCOPY WITH PROPOFOL (N/A )  Patient location during evaluation: PACU Anesthesia Type: General Level of consciousness: awake and alert Pain management: pain level controlled Vital Signs Assessment: post-procedure vital signs reviewed and stable Respiratory status: spontaneous breathing, nonlabored ventilation, respiratory function stable and patient connected to nasal cannula oxygen Cardiovascular status: blood pressure returned to baseline and stable Postop Assessment: no apparent nausea or vomiting Anesthetic complications: no     Last Vitals:  Vitals:   11/12/17 1113 11/12/17 1123  BP: (!) 167/69 (!) 170/77  Pulse:    Resp:    Temp:    SpO2:      Last Pain:  Vitals:   11/12/17 1123  TempSrc:   PainSc: 0-No pain                 Molli Barrows

## 2017-11-14 ENCOUNTER — Telehealth: Payer: Self-pay | Admitting: General Surgery

## 2017-11-14 LAB — SURGICAL PATHOLOGY

## 2017-11-14 NOTE — Telephone Encounter (Signed)
The patient was notified that the pathology results showed one tubular adenoma.  This would warrant a follow-up exam in 5 years.  She reports tolerating the procedure well.

## 2018-02-03 ENCOUNTER — Encounter: Payer: Self-pay | Admitting: General Surgery

## 2018-02-03 ENCOUNTER — Ambulatory Visit (INDEPENDENT_AMBULATORY_CARE_PROVIDER_SITE_OTHER): Payer: Medicare Other | Admitting: General Surgery

## 2018-02-03 ENCOUNTER — Other Ambulatory Visit: Payer: Self-pay

## 2018-02-03 VITALS — BP 155/86 | HR 108 | Temp 97.3°F | Resp 16 | Ht 61.0 in | Wt 132.2 lb

## 2018-02-03 DIAGNOSIS — Z853 Personal history of malignant neoplasm of breast: Secondary | ICD-10-CM

## 2018-02-03 NOTE — Progress Notes (Signed)
Patient ID: Erin Roberts, female   DOB: [birth year], 67 y.o.   MRN: [id number]  Chief Complaint  Patient presents with  . Follow-up    1 year follow up breast cancer 2014    HPI Erin Roberts is a 67 y.o. female.  Here today for follow up breast cancer 2014. No areas of concerns at this time.  HPI  Past Medical History:  Diagnosis Date  . BARD1 gene mutation positive 08/20/2013   Increased risk of breast cancer, no clear change in screening guidelines at time of testin.   . Barrett's esophagus 2008  . Breast cancer ([place]) 1990   Infiltrating ductal carcinoma  . Carpal tunnel syndrome 1990's  . Chicken pox   . Colon polyps 2006  . Depression   . Fat necrosis of breast 2007   Left  . GERD (gastroesophageal reflux disease)   . Hyperlipidemia   . Hypertension 2013  . Malignant neoplasm of breast (female), unspecified site 12/2012   Left, 4 mm. T1a,Nx; ER 90%, PR 40%, HER-2/neu not amplified. No indication for antiestrogen therapy after [facility] tumor board Presentation.    Past Surgical History:  Procedure Laterality Date  . ABDOMINAL HYSTERECTOMY  1997  . APPENDECTOMY  1970  . BREAST REDUCTION SURGERY  2004  . BREAST SURGERY Right 1990   infiltrating ductal carcinoma  . BREAST SURGERY Bilateral 12-28-12   mastectomy  . [place], 2004  . cataracts Bilateral 2017   [facility]  . CHOLECYSTECTOMY  1970  . COLONOSCOPY  March 2014   4 rectal polyps, one tubular adenoma. Followup exam due in 2019.  Marland Kitchen COLONOSCOPY WITH PROPOFOL N/A 11/12/2017   Procedure: COLONOSCOPY WITH PROPOFOL;  Surgeon: Erin Bellow, MD;  Location: [facility] ENDOSCOPY;  Service: Endoscopy;  Laterality: N/A;  . kidney biospy    . MASTECTOMY  2014  . TONSILLECTOMY  1967  . UPPER GI ENDOSCOPY  February 2014   No Barrett's epithelium reported.    Family History  Problem Relation Age of Onset  . Colon cancer Father 68  . Colon cancer Mother 78  . Hyperlipidemia Mother   . Hypertension  Mother   . Kidney disease Mother   . Colon cancer Sister 69  . Diabetes Sister   . Diabetes Brother     Social History Social History   Tobacco Use  . Smoking status: Never Smoker  . Smokeless tobacco: Never Used  Substance Use Topics  . Alcohol use: Yes    Alcohol/week: 4.0 standard drinks    Types: 4 Glasses of wine per week  . Drug use: No    Allergies  Allergen Reactions  . Codeine     REACTION: nausea/vomiting    Current Outpatient Medications  Medication Sig Dispense Refill  . clobetasol ointment (TEMOVATE) 7.85 % Apply 1 application topically 2 (two) times daily.    . colchicine 0.6 MG tablet Take by mouth.    Marland Kitchen omeprazole (PRILOSEC) 20 MG capsule Take 1 capsule (20 mg total) by mouth 2 (two) times daily. 180 capsule 3  . simvastatin (ZOCOR) 80 MG tablet Take 1 tablet (80 mg total) by mouth daily. 90 tablet 3  . triamterene-hydrochlorothiazide (MAXZIDE-25) 37.5-25 MG tablet Take 0.5 tablets by mouth daily. 45 tablet 3  . venlafaxine (EFFEXOR) 75 MG tablet Take 1 tablet (75 mg total) by mouth 2 (two) times daily with a meal. 180 tablet 3  . zolpidem (AMBIEN) 10 MG tablet TAKE ONE-HALF TABLET AT BEDTIME 90 tablet  0   No current facility-administered medications for this visit.     Review of Systems Review of Systems  Constitutional: Negative.   Respiratory: Negative.   Cardiovascular: Negative.   Skin: Negative.     Blood pressure (!) 155/86, pulse (!) 108, temperature (!) 97.3 F (36.3 C), temperature source Temporal, resp. rate 16, height '5\' 1"'$  (1.549 m), weight 132 lb 3.2 oz (60 kg), SpO2 97 %.  Physical Exam Physical Exam  Constitutional: She is oriented to person, place, and time. She appears well-developed and well-nourished.  Neck: Normal range of motion.  Cardiovascular: Normal rate, regular rhythm and normal heart sounds.  Pulmonary/Chest: Effort normal and breath sounds normal. Right breast exhibits no inverted nipple, no mass, no nipple  discharge, no skin change and no tenderness. Left breast exhibits no inverted nipple, no mass, no nipple discharge, no skin change and no tenderness.    Lymphadenopathy:    She has no axillary adenopathy.  Neurological: She is alert and oriented to person, place, and time.  Skin: Skin is warm and dry.    Data Reviewed November 12, 2017 colonoscopy results: Surgical Pathology  CASE: [id number]  PATIENT: Erin Roberts  Surgical Pathology Report      SPECIMEN SUBMITTED:  A. Colon polyp, hepatic flexure;cbx  B. Rectum polyp x2;cbx   CLINICAL HISTORY:  None provided   PRE-OPERATIVE DIAGNOSIS:  HX colon polyp   POST-OPERATIVE DIAGNOSIS:  Colon polyps, diverticulosis      DIAGNOSIS:  A. COLON POLYP, HEPATIC FLEXURE; COLD BIOPSY:  - TUBULAR ADENOMA.  - NEGATIVE FOR HIGH-GRADE DYSPLASIA AND MALIGNANCY.   B. RECTUM POLYP X2; COLD BIOPSY:  - HYPERPLASTIC POLYP (2).  - NEGATIVE FOR DYSPLASIA AND MALIGNANCY.   Assessment    No evidence of recurrent breast cancer.  Candidate for follow-up colonoscopy in 2024.    Plan        Follow up in 2 years for chest wall exam, earlier if patient aware of any changes.    Prescription for new breast prostheses and bra provided.  Opportunity for excision of redundant tissue on the left chest wall if desired or plastic surgery referral.  Declined at present.  Colonoscopy 2024.   HPI, Physical Exam, Assessment and Plan have been scribed under the direction and in the presence of Erin Bellow, MD. Erin Roberts, CMA  I have completed the exam and reviewed the above documentation for accuracy and completeness.  I agree with the above.  Haematologist has been used and any errors in dictation or transcription are unintentional.  Hervey Ard, M.D., F.A.C.S.  Forest Gleason Francile Woolford 02/04/2018, 5:56 AM

## 2018-02-03 NOTE — Patient Instructions (Addendum)
Follow up in 2 years for exam. Colonoscopy 2024.

## 2018-10-20 ENCOUNTER — Encounter: Payer: Self-pay | Admitting: General Surgery

## 2019-05-20 ENCOUNTER — Ambulatory Visit: Payer: Medicare Other

## 2019-05-20 ENCOUNTER — Ambulatory Visit: Payer: Medicare Other | Attending: Internal Medicine

## 2019-05-20 DIAGNOSIS — Z23 Encounter for immunization: Secondary | ICD-10-CM | POA: Insufficient documentation

## 2019-05-20 NOTE — Progress Notes (Signed)
   Covid-19 Vaccination Clinic  Name:  Erin Roberts    MRN: [id number] DOB: 09/19/1950  05/20/2019  Ms. Erin Roberts was observed post Covid-19 immunization for 15 minutes without incident. She was provided with Vaccine Information Sheet and instruction to access the V-Safe system.   Ms. Erin Roberts was instructed to call 911 with any severe reactions post vaccine: Marland Kitchen Difficulty breathing  . Swelling of face and throat  . A fast heartbeat  . A bad rash all over body  . Dizziness and weakness   Immunizations Administered    Name Date Dose VIS Date Route   Pfizer COVID-19 Vaccine 05/20/2019 12:25 PM 0.3 mL 02/26/2019 Intramuscular   Manufacturer: [place]   Lot: [id number]   [place]: [id number]

## 2019-06-10 ENCOUNTER — Ambulatory Visit: Payer: Medicare Other | Attending: Internal Medicine

## 2019-06-10 DIAGNOSIS — Z23 Encounter for immunization: Secondary | ICD-10-CM

## 2019-06-10 NOTE — Progress Notes (Signed)
   Covid-19 Vaccination Clinic  Name:  Erin Roberts    MRN: [id number] DOB: May 04, 1950  06/10/2019  [name] was observed post Covid-19 immunization for 15 minutes without incident. She was provided with Vaccine Information Sheet and instruction to access the V-Safe system.   [name] was instructed to call 911 with any severe reactions post vaccine: Marland Kitchen Difficulty breathing  . Swelling of face and throat  . A fast heartbeat  . A bad rash all over body  . Dizziness and weakness   Immunizations Administered    Name Date Dose VIS Date Route   Pfizer COVID-19 Vaccine 06/10/2019 11:39 AM 0.3 mL 02/26/2019 Intramuscular   Manufacturer: [place]   Lot: [id number]   [place]: [id number]
